# Patient Record
Sex: Female | Born: 2000 | Race: Black or African American | Hispanic: No | Marital: Single | Smoking: Never smoker
Health system: Southern US, Community
[De-identification: ages and names within clinical notes are randomized; demographics above are authoritative.]

## PROBLEM LIST (undated history)

## (undated) DIAGNOSIS — I82409 Acute embolism and thrombosis of unspecified deep veins of unspecified lower extremity: Secondary | ICD-10-CM

---
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
## 2019-01-09 IMAGING — DX DG CHEST 2V
2 series · 2 of 2 positions shown · non-contrast
Comparison: November 21, 2011

CLINICAL DATA: Recent deep venous thrombosis

EXAM:
CHEST - 2 VIEW

[chest pa]
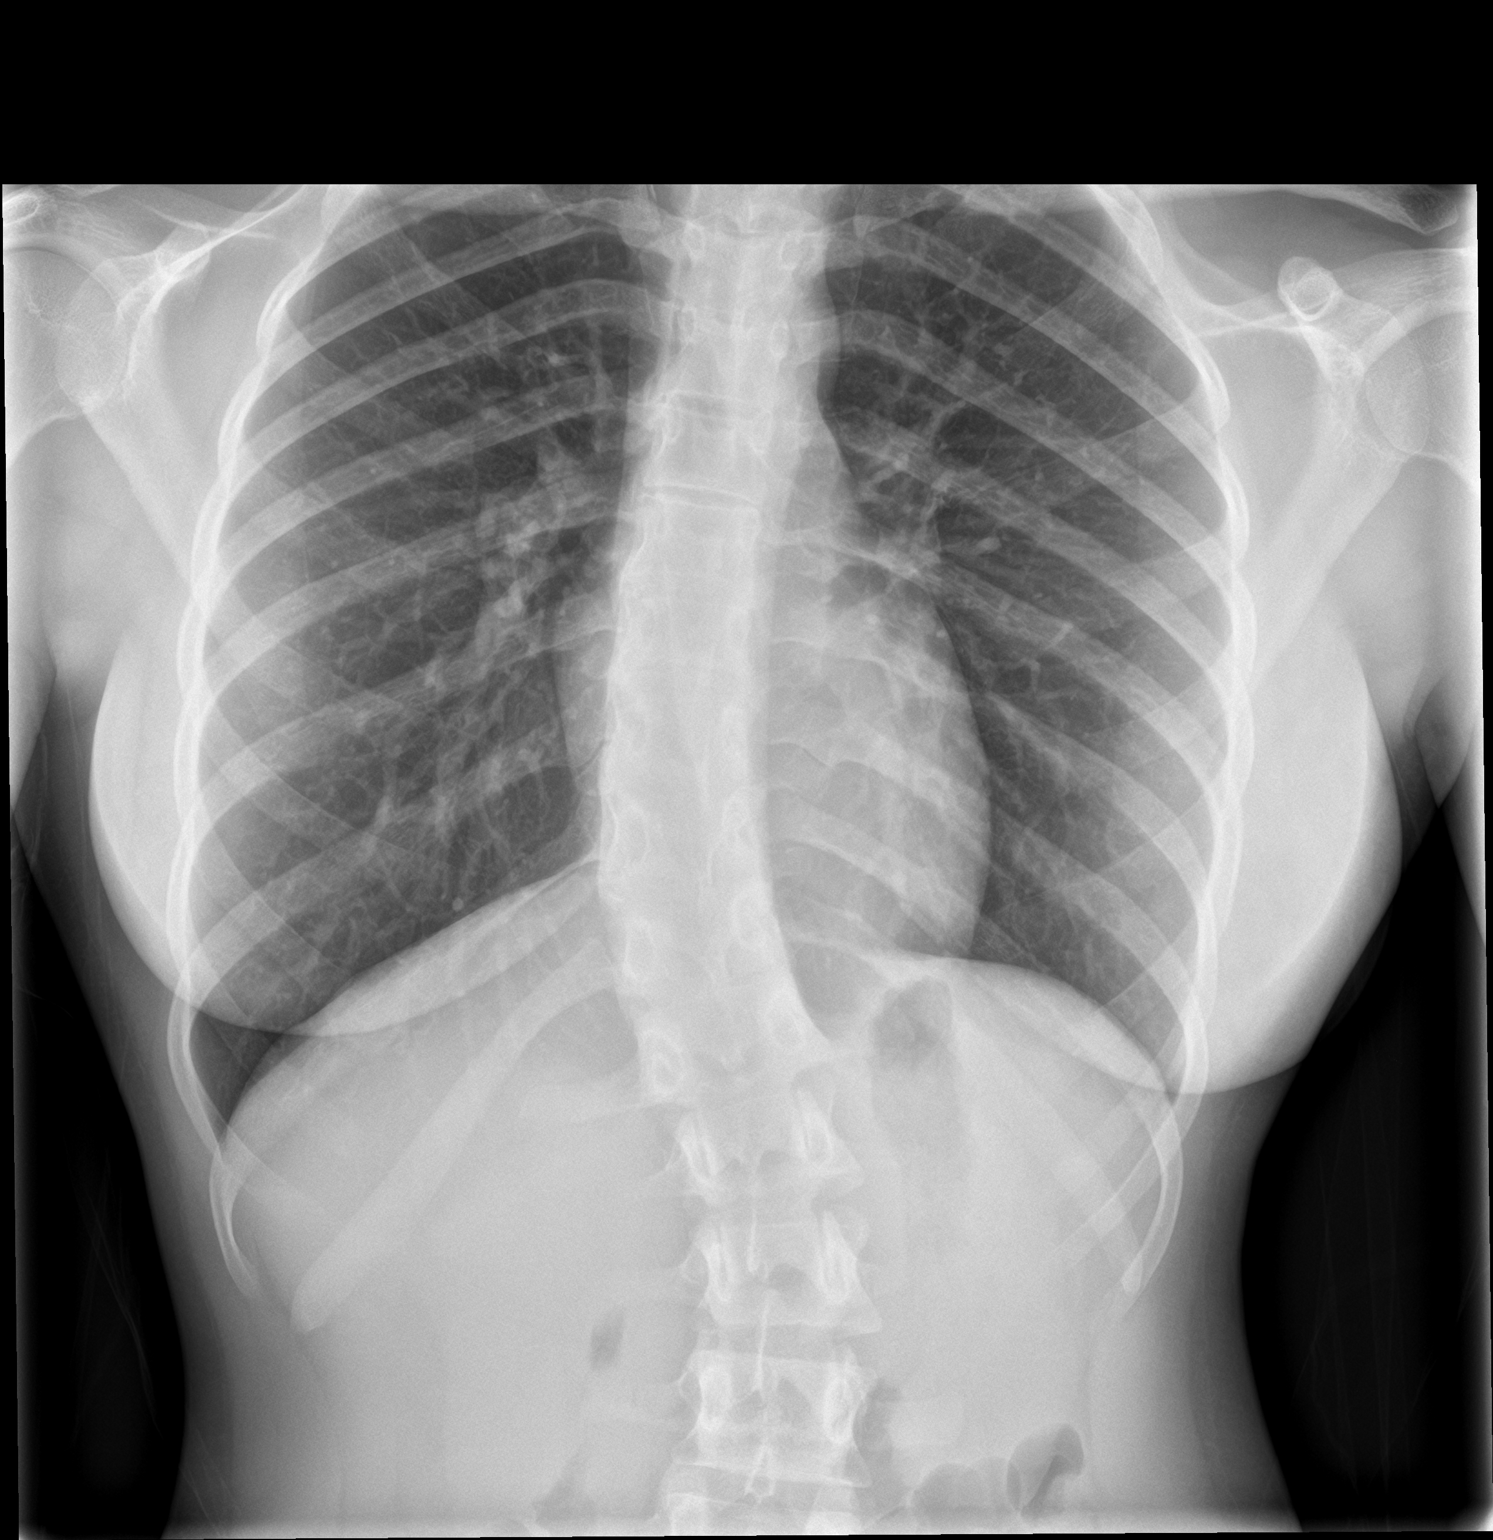

[chest lat]
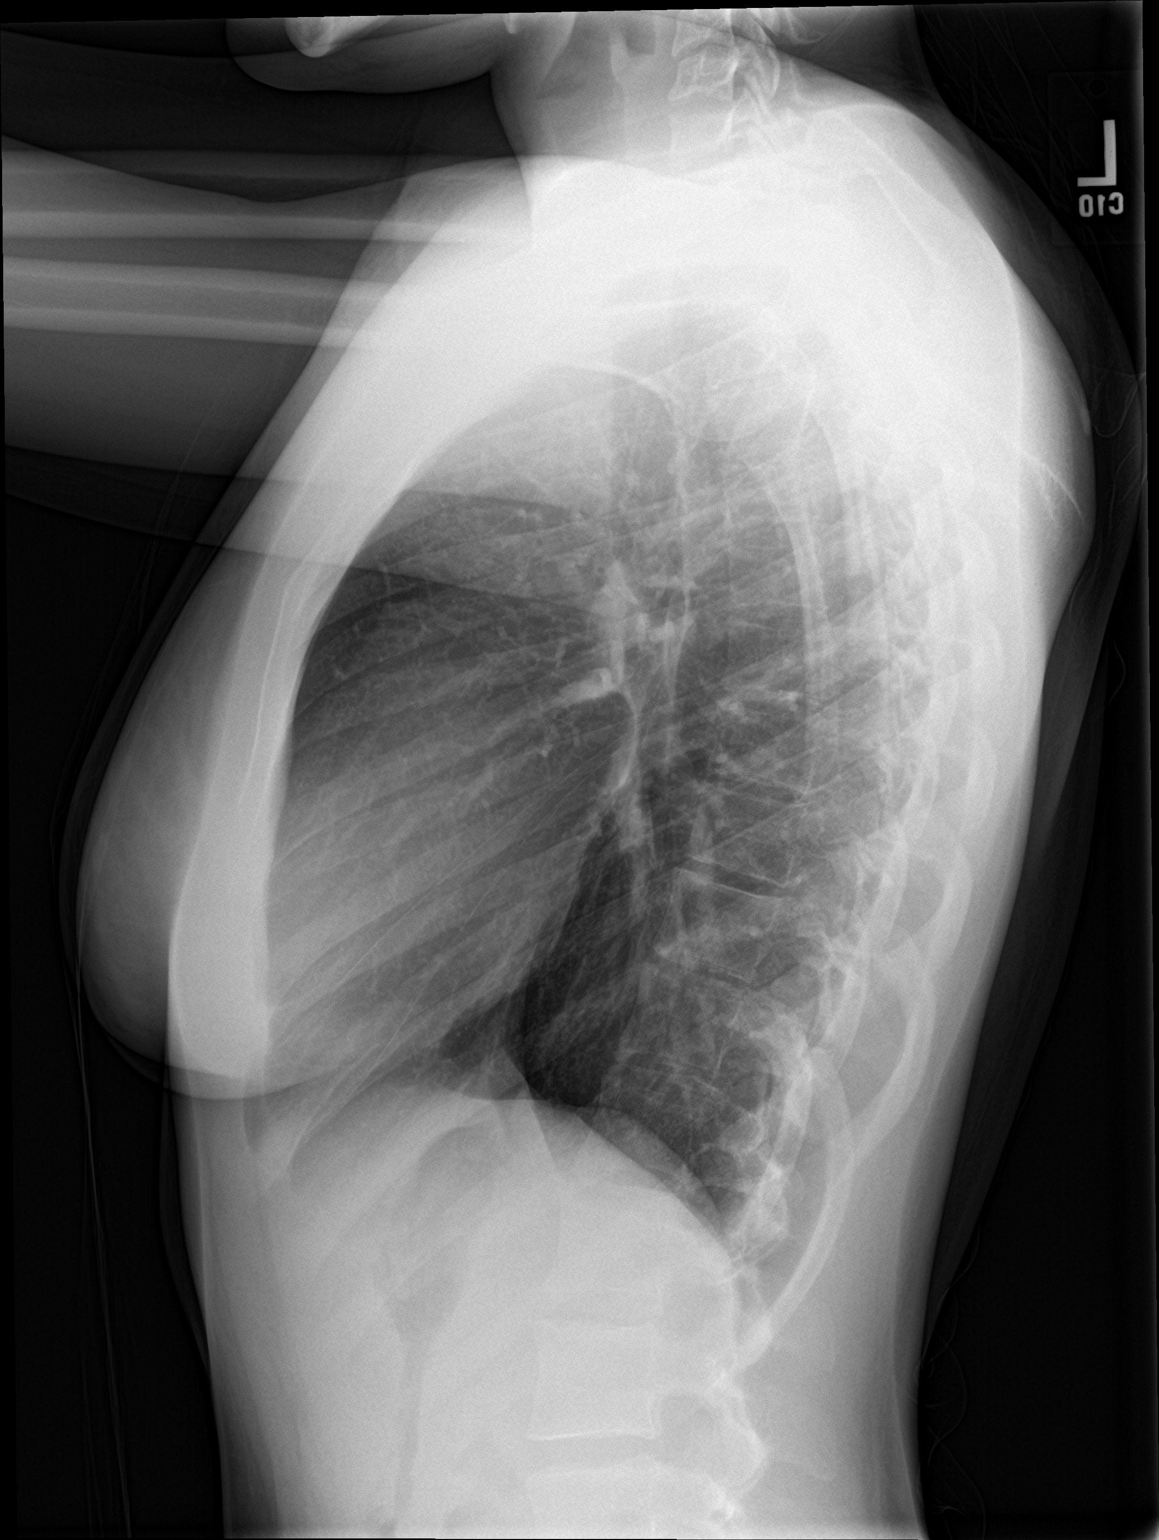

[2 of 2 positions shown; findings below may reference images not displayed]

FINDINGS: Lungs are clear. The heart size and pulmonary vascularity are
normal. No adenopathy. There is lower thoracic dextroscoliosis with
upper lumbar levoscoliosis.
IMPRESSION: Scoliosis.  Lungs clear.  No evident adenopathy.

## 2019-01-16 ENCOUNTER — Other Ambulatory Visit: Payer: Self-pay

## 2019-01-17 ENCOUNTER — Encounter: Payer: Self-pay | Admitting: Family

## 2019-01-17 ENCOUNTER — Ambulatory Visit (INDEPENDENT_AMBULATORY_CARE_PROVIDER_SITE_OTHER): Payer: Medicaid Other | Admitting: Family

## 2019-01-17 ENCOUNTER — Ambulatory Visit (INDEPENDENT_AMBULATORY_CARE_PROVIDER_SITE_OTHER): Payer: Medicaid Other

## 2019-01-17 VITALS — BP 117/66 | HR 73 | Temp 98.0°F | Ht 61.0 in | Wt 95.2 lb

## 2019-01-17 DIAGNOSIS — R634 Abnormal weight loss: Secondary | ICD-10-CM

## 2019-01-17 NOTE — Patient Instructions (Signed)

## 2019-01-17 NOTE — Progress Notes (Signed)
Subjective:    Patient ID: Nicole Hodge, female    DOB: 2000/03/24, 18 y.o.   MRN: [id number]  Chief Complaint  Patient presents with  . discuss weight    HPI  Pt presents to the office today to discuss weight loss. She weighed 119 lb in 11/16/17 and now weighs 95 lbs.  She has not tried to lose weight and states she eats 2-3 times a day. She wants to gain weight and feels like she is "too skinny". She is not active and does not do any scheduled  Exercise.   She has had two DVT's and is followed by Hematologists. She is currently taking xarelto 20 mg. She states she talked to her Hematologists about her weight loss and was told to follow up with her PCP.   She denies any fevers, dysuria, fatigue, abdominal pain, SOB, or chest pain.   She does work third shift. She states yesterday she ate 1  soft taco, 2 sloppy joes, 1 ham and cheese hot pocket, 2 small bags of  chips, fruit roll ups, and 3 granola bars.   Review of Systems  All other systems reviewed and are negative.      Objective:   Physical Exam Vitals reviewed.  Constitutional:      General: She is not in acute distress.    Appearance: She is well-developed.     Comments: underweight  HENT:     Head: Normocephalic and atraumatic.     Right Ear: Tympanic membrane normal.     Left Ear: Tympanic membrane normal.  Eyes:     Pupils: Pupils are equal, round, and reactive to light.  Neck:     Thyroid: No thyromegaly.  Cardiovascular:     Rate and Rhythm: Normal rate and regular rhythm.     Heart sounds: Normal heart sounds. No murmur.  Pulmonary:     Effort: Pulmonary effort is normal. No respiratory distress.     Breath sounds: Normal breath sounds. No wheezing.  Abdominal:     General: Bowel sounds are normal. There is no distension.     Palpations: Abdomen is soft.     Tenderness: There is no abdominal tenderness.  Musculoskeletal:        General: No tenderness. Normal range of motion.     Cervical back:  Normal range of motion and neck supple.  Skin:    General: Skin is warm and dry.  Neurological:     Mental Status: She is alert and oriented to person, place, and time.     Cranial Nerves: No cranial nerve deficit.     Deep Tendon Reflexes: Reflexes are normal and symmetric.  Psychiatric:        Behavior: Behavior normal.        Thought Content: Thought content normal.        Judgment: Judgment normal.       BP 117/66   Pulse 73   Temp 98 F (36.7 C) (Temporal)   Ht '5\' 1"'$  (1.549 m)   Wt 95 lb 3.2 oz (43.2 kg)   SpO2 99%   BMI 17.99 kg/m      Assessment & Plan:  Nicole Hodge comes in today with chief complaint of discuss weight   Diagnosis and orders addressed:  1. Weight loss Labs pending  High calorie diet discussed If all labs and x-ray normal will send to Endo to review Could be hormone related as she has had multiple birth controls this year  or related to working third shift. She does state that she eats well when she works night shift. According to her food log, she eats enough not to be losing weight.   - CMP14+EGFR - TSH - DG Chest 2 View; Future     Evelina Dun, FNP

## 2019-01-18 LAB — TSH: TSH: 2.26 u[IU]/mL (ref 0.450–4.500)

## 2019-01-18 LAB — CMP14+EGFR
ALT: 20 IU/L (ref 0–32)
AST: 24 IU/L (ref 0–40)
Albumin/Globulin Ratio: 1.4 (ref 1.2–2.2)
Albumin: 4.5 g/dL (ref 3.9–5.0)
Alkaline Phosphatase: 79 IU/L (ref 39–117)
BUN/Creatinine Ratio: 15 (ref 9–23)
BUN: 14 mg/dL (ref 6–20)
Bilirubin Total: 0.5 mg/dL (ref 0.0–1.2)
CO2: 21 mmol/L (ref 20–29)
Calcium: 9.5 mg/dL (ref 8.7–10.2)
Chloride: 108 mmol/L — ABNORMAL HIGH (ref 96–106)
Creatinine, Ser: 0.96 mg/dL (ref 0.57–1.00)
GFR calc Af Amer: 99 mL/min/{1.73_m2} (ref >59)
GFR calc non Af Amer: 86 mL/min/{1.73_m2} (ref >59)
Globulin, Total: 3.2 g/dL (ref 1.5–4.5)
Glucose: 72 mg/dL (ref 65–99)
Potassium: 3.6 mmol/L (ref 3.5–5.2)
Sodium: 144 mmol/L (ref 134–144)
Total Protein: 7.7 g/dL (ref 6.0–8.5)

## 2019-01-22 ENCOUNTER — Other Ambulatory Visit (HOSPITAL_COMMUNITY): Payer: Self-pay | Admitting: Surgery

## 2019-01-22 ENCOUNTER — Telehealth (HOSPITAL_COMMUNITY): Payer: Self-pay | Admitting: *Deleted

## 2019-01-22 ENCOUNTER — Other Ambulatory Visit (HOSPITAL_COMMUNITY): Payer: Self-pay | Admitting: Hematology

## 2019-01-22 ENCOUNTER — Telehealth (HOSPITAL_COMMUNITY): Payer: Self-pay | Admitting: Surgery

## 2019-01-22 DIAGNOSIS — D509 Iron deficiency anemia, unspecified: Secondary | ICD-10-CM

## 2019-01-22 MED ORDER — FERROUS SULFATE 325 (65 FE) MG PO TBEC: 325.0000 mg | DELAYED_RELEASE_TABLET | Freq: Every day | ORAL | 3 refills | Status: DC

## 2019-01-22 MED ORDER — VITAMIN D (CHOLECALCIFEROL) 50 MCG (2000 UT) PO CAPS: 50.0000 ug | ORAL_CAPSULE | Freq: Every day | ORAL | 5 refills | Status: DC

## 2019-01-22 NOTE — Telephone Encounter (Signed)
Pt had left a message asking if her Vit D prescription could be changed to a different form covered by medicaid.  Joseph Art was made aware of the pt's situation and changed the medication to Vit D 3.    I called the pt back to let her know of the change and she verbalized understanding.

## 2019-01-29 ENCOUNTER — Telehealth: Payer: Self-pay | Admitting: *Deleted

## 2019-01-29 NOTE — Telephone Encounter (Signed)
Virtual Visit Pre-Appointment Phone Call  Today, I spoke with Nicole Hodge and performed the following actions:  1. I explained that we are currently trying to limit exposure to the COVID-19 virus by seeing patients at home rather than in the office.  I explained that the visits are best done by video, but can be done by telephone.  I asked the patient if a virtual visit that the patient would like to try instead of coming into the office. Nicole Hodge agreed to proceed with the virtual visit scheduled with PA on 02/04/2019.        2. I confirmed the BEST phone number to call the day of the visit and- I included this in appointment notes.  3. I asked if the patient had access to (through a family member/friend) a smartphone with video capability to be used for her visit?"  The patient said yes -          4. I confirmed consent by  a. sending through MyChart or by email the FULL LENGTH CONSENT FOR TELE-HEALTH VISIT as written at the end of this message or  b. verbally as listed below. i. This visit is being performed in the setting of COVID-19. ii. All virtual visits are billed to your insurance company just like a normal visit would be.   iii. We'd like you to understand that the technology does not allow for your provider to perform an examination, and thus may limit your provider's ability to fully assess your condition.  iv. If your provider identifies any concerns that need to be evaluated in person, we will make arrangements to do so.   v. Finally, though the technology is pretty good, we cannot assure that it will always work on either your or our end, and in the setting of a video visit, we may have to convert it to a phone-only visit.  In either situation, we cannot ensure that we have a secure connection.   vi. Are you willing to proceed?"  STAFF: Did the patient verbally acknowledge consent to telehealth visit? Document YES/NO here: YES  2. I advised the patient to be  prepared - I asked that the patient, on the day of her visit, record any information possible with the equipment at her home, such as blood pressure, pulse, oxygen saturation, and your weight and write them all down. I asked the patient to have a pen and paper handy nearby the day of the visit as well.  3. If the patient was scheduled for a video visit, I informed the patient that the visit with the doctor would start with a text to the smartphone # given to [place] by the patient.         If the patient was scheduled for a telephone call, I informed the patient that the visit with the doctor would start with a call to the telephone # given to [place] by the patient.  4. I Informed patient they will receive a phone call 15 minutes prior to their appointment time from a CMA or nurse to review medications, allergies, etc. to prepare for the visit.    TELEPHONE CALL NOTE  Nicole Hodge has been deemed a candidate for a follow-up tele-health visit to limit community exposure during the Covid-19 pandemic. I spoke with the patient via phone to ensure availability of phone/video source, confirm preferred email & phone number, and discuss instructions and expectations.  I reminded Nicole Hodge  to be prepared with any vital sign and/or heart rhythm information that could potentially be obtained via home monitoring, at the time of her visit. I reminded Nicole Hodge to expect a phone call prior to her visit.  [name], NT 01/29/2019 9:53 AM     FULL LENGTH CONSENT FOR TELE-HEALTH VISIT   I hereby voluntarily request, consent and authorize [facility] and its employed or contracted physicians, physician assistants, nurse practitioners or other licensed health care professionals (the Practitioner), to provide me with telemedicine health care services (the "Services") as deemed necessary by the treating Practitioner. I acknowledge and consent to receive the Services by the Practitioner via  telemedicine. I understand that the telemedicine visit will involve communicating with the Practitioner through live audiovisual communication technology and the disclosure of certain medical information by electronic transmission. I acknowledge that I have been given the opportunity to request an in-person assessment or other available alternative prior to the telemedicine visit and am voluntarily participating in the telemedicine visit.  I understand that I have the right to withhold or withdraw my consent to the use of telemedicine in the course of my care at any time, without affecting my right to future care or treatment, and that the Practitioner or I may terminate the telemedicine visit at any time. I understand that I have the right to inspect all information obtained and/or recorded in the course of the telemedicine visit and may receive copies of available information for a reasonable fee.  I understand that some of the potential risks of receiving the Services via telemedicine include:  Marland Kitchen Delay or interruption in medical evaluation due to technological equipment failure or disruption; . Information transmitted may not be sufficient (e.g. poor resolution of images) to allow for appropriate medical decision making by the Practitioner; and/or  . In rare instances, security protocols could fail, causing a breach of personal health information.  Furthermore, I acknowledge that it is my responsibility to provide information about my medical history, conditions and care that is complete and accurate to the best of my ability. I acknowledge that Practitioner's advice, recommendations, and/or decision may be based on factors not within their control, such as incomplete or inaccurate data provided by me or distortions of diagnostic images or specimens that may result from electronic transmissions. I understand that the practice of medicine is not an exact science and that Practitioner makes no warranties or  guarantees regarding treatment outcomes. I acknowledge that I will receive a copy of this consent concurrently upon execution via email to the email address I last provided but may also request a printed copy by calling the office of [place].    I understand that my insurance will be billed for this visit.   I have read or had this consent read to me. . I understand the contents of this consent, which adequately explains the benefits and risks of the Services being provided via telemedicine.  . I have been provided ample opportunity to ask questions regarding this consent and the Services and have had my questions answered to my satisfaction. . I give my informed consent for the services to be provided through the use of telemedicine in my medical care  By participating in this telemedicine visit I agree to the above.

## 2019-01-30 ENCOUNTER — Other Ambulatory Visit: Payer: Self-pay

## 2019-01-30 ENCOUNTER — Telehealth (HOSPITAL_COMMUNITY): Payer: Self-pay

## 2019-01-30 DIAGNOSIS — I82521 Chronic embolism and thrombosis of right iliac vein: Secondary | ICD-10-CM

## 2019-01-30 DIAGNOSIS — I82421 Acute embolism and thrombosis of right iliac vein: Secondary | ICD-10-CM

## 2019-01-30 NOTE — Telephone Encounter (Signed)

## 2019-02-03 ENCOUNTER — Ambulatory Visit (HOSPITAL_COMMUNITY)
Admission: RE | Admit: 2019-02-03 | Discharge: 2019-02-03 | Disposition: A | Discharge status: Home / Self Care | Payer: Medicaid Other | Source: Ambulatory Visit | Attending: Surgery | Admitting: Surgery

## 2019-02-03 ENCOUNTER — Other Ambulatory Visit: Payer: Self-pay

## 2019-02-03 ENCOUNTER — Ambulatory Visit (INDEPENDENT_AMBULATORY_CARE_PROVIDER_SITE_OTHER)
Admission: RE | Admit: 2019-02-03 | Discharge: 2019-02-03 | Disposition: A | Discharge status: Home / Self Care | Payer: Medicaid Other | Source: Ambulatory Visit | Attending: Surgery | Admitting: Surgery

## 2019-02-03 DIAGNOSIS — I82521 Chronic embolism and thrombosis of right iliac vein: Secondary | ICD-10-CM | POA: Insufficient documentation

## 2019-02-04 ENCOUNTER — Ambulatory Visit (INDEPENDENT_AMBULATORY_CARE_PROVIDER_SITE_OTHER): Payer: Medicaid Other | Admitting: Physician Assistant

## 2019-02-04 VITALS — Temp 97.0°F | Ht 61.0 in | Wt 95.0 lb

## 2019-02-04 DIAGNOSIS — I82421 Acute embolism and thrombosis of right iliac vein: Secondary | ICD-10-CM

## 2019-02-04 NOTE — Progress Notes (Signed)
    Virtual Visit via Telephone Note  I connected with Nicole Hodge on 02/04/2019 using the [email or web address] by telephone and verified that I was speaking with the correct person using two identifiers. Patient was located at home. I am located at [facility] office.   The limitations of evaluation and management by telemedicine and the availability of in person appointments have been previously discussed with the patient and are documented in the patients chart. The patient expressed understanding and consented to proceed.  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  She underwent mechanical thrombectomy of R common iliac, external iliac, and common femoral vein by Dr. Chestine Spore in May of 2020.  She has been wearing thigh high compression regularly since surgery.  She still has edema of RLE however this is manageable.  She denies venous ulcerations.  She is taking her Xarelto daily.  She does state that her compression is no longer tight and are easier to get on.  She had follow up venous duplex yesterday.  Past Medical History:  Diagnosis Date  . DVT (deep venous thrombosis) (HCC)     Past Surgical History:  Procedure Laterality Date  . IVC VENOGRAPHY N/A 06/06/2018   Procedure: IVC Venography;  Surgeon: Maeola Harman, MD;  [facility]: [facility] INVASIVE CV LAB;  Service: Cardiovascular;  Laterality: N/A;  . PERIPHERAL VASCULAR THROMBECTOMY N/A 06/06/2018   Procedure: PERIPHERAL VASCULAR THROMBECTOMY;  Surgeon: Maeola Harman, MD;  Location: [facility] INVASIVE CV LAB;  Service: Cardiovascular;  Laterality: N/A;    Current Meds  Medication Sig  . ergocalciferol (VITAMIN D2) 1.25 MG (50000 UT) capsule Take 1 capsule (50,000 Units total) by mouth once a week.  . Vitamin D, Cholecalciferol, 50 MCG (2000 UT) CAPS Take 50 mcg by  mouth daily.  Carlena Hurl 20 MG TABS tablet TAKE 1 TABLET BY MOUTH DAILY WITH SUPPER. (Patient taking differently: Take 20 mg by mouth every morning. 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 The patient was provided an opportunity to ask questions and all were answered. The patient agreed with the plan and demonstrated an understanding of the instructions.   The patient was advised to call back or seek an in-person evaluation if the symptoms worsen or if the condition fails to improve as anticipated.  I spent 15 minutes with the patient via telephone encounter.   Signed, Emilie Rutter [facility] Office: [phone]  02/04/2019, 2:43 PM

## 2019-02-11 ENCOUNTER — Other Ambulatory Visit: Payer: Self-pay | Admitting: *Deleted

## 2019-02-11 DIAGNOSIS — I82421 Acute embolism and thrombosis of right iliac vein: Secondary | ICD-10-CM

## 2019-02-11 DIAGNOSIS — I82521 Chronic embolism and thrombosis of right iliac vein: Secondary | ICD-10-CM

## 2019-04-09 ENCOUNTER — Inpatient Hospital Stay (HOSPITAL_COMMUNITY): Payer: Medicaid Other | Attending: Hematology

## 2019-04-10 ENCOUNTER — Ambulatory Visit (HOSPITAL_COMMUNITY): Payer: Medicaid Other | Admitting: Nurse Practitioner

## 2019-04-17 ENCOUNTER — Inpatient Hospital Stay (HOSPITAL_COMMUNITY): Payer: Medicaid Other | Attending: Hematology

## 2019-04-23 ENCOUNTER — Ambulatory Visit (HOSPITAL_COMMUNITY): Payer: Medicaid Other | Admitting: Nurse Practitioner

## 2019-04-28 ENCOUNTER — Other Ambulatory Visit: Payer: Self-pay

## 2019-04-28 ENCOUNTER — Inpatient Hospital Stay (HOSPITAL_COMMUNITY): Payer: Medicaid Other | Attending: Hematology

## 2019-04-28 DIAGNOSIS — I82421 Acute embolism and thrombosis of right iliac vein: Secondary | ICD-10-CM | POA: Diagnosis not present

## 2019-04-28 LAB — CBC WITH DIFFERENTIAL/PLATELET
Abs Immature Granulocytes: 0.02 10*3/uL (ref 0.00–0.07)
Basophils Absolute: 0 10*3/uL (ref 0.0–0.1)
Basophils Relative: 0 %
Eosinophils Absolute: 0.1 10*3/uL (ref 0.0–0.5)
Eosinophils Relative: 1 %
HCT: 38.1 % (ref 36.0–46.0)
Hemoglobin: 11.9 g/dL — ABNORMAL LOW (ref 12.0–15.0)
Immature Granulocytes: 0 %
Lymphocytes Relative: 41 %
Lymphs Abs: 2.6 10*3/uL (ref 0.7–4.0)
MCH: 27 pg (ref 26.0–34.0)
MCHC: 31.2 g/dL (ref 30.0–36.0)
MCV: 86.4 fL (ref 80.0–100.0)
Monocytes Absolute: 0.3 10*3/uL (ref 0.1–1.0)
Monocytes Relative: 5 %
Neutro Abs: 3.3 10*3/uL (ref 1.7–7.7)
Neutrophils Relative %: 53 %
Platelets: 288 10*3/uL (ref 150–400)
RBC: 4.41 MIL/uL (ref 3.87–5.11)
RDW: 17.8 % — ABNORMAL HIGH (ref 11.5–15.5)
WBC: 6.4 10*3/uL (ref 4.0–10.5)
nRBC: 0 % (ref 0.0–0.2)

## 2019-04-28 LAB — FERRITIN: Ferritin: 6 ng/mL — ABNORMAL LOW (ref 11–307)

## 2019-04-28 LAB — COMPREHENSIVE METABOLIC PANEL
ALT: 19 U/L (ref 0–44)
AST: 21 U/L (ref 15–41)
Albumin: 4.2 g/dL (ref 3.5–5.0)
Alkaline Phosphatase: 66 U/L (ref 38–126)
Anion gap: 10 (ref 5–15)
BUN: 16 mg/dL (ref 6–20)
CO2: 26 mmol/L (ref 22–32)
Calcium: 9.4 mg/dL (ref 8.9–10.3)
Chloride: 101 mmol/L (ref 98–111)
Creatinine, Ser: 0.82 mg/dL (ref 0.44–1.00)
GFR calc Af Amer: >60 mL/min (ref >60)
GFR calc non Af Amer: >60 mL/min (ref >60)
Glucose, Bld: 91 mg/dL (ref 70–99)
Potassium: 4 mmol/L (ref 3.5–5.1)
Sodium: 137 mmol/L (ref 135–145)
Total Bilirubin: 0.4 mg/dL (ref 0.3–1.2)
Total Protein: 8 g/dL (ref 6.5–8.1)

## 2019-04-28 LAB — IRON AND TIBC
Iron: 25 ug/dL — ABNORMAL LOW (ref 28–170)
Saturation Ratios: 6 % — ABNORMAL LOW (ref 10.4–31.8)
TIBC: 397 ug/dL (ref 250–450)
UIBC: 372 ug/dL

## 2019-04-28 LAB — VITAMIN D 25 HYDROXY (VIT D DEFICIENCY, FRACTURES): Vit D, 25-Hydroxy: 15 ng/mL — ABNORMAL LOW (ref 30–100)

## 2019-04-28 LAB — FOLATE: Folate: 10.3 ng/mL (ref >5.9)

## 2019-04-28 LAB — LACTATE DEHYDROGENASE: LDH: 136 U/L (ref 98–192)

## 2019-04-28 LAB — VITAMIN B12: Vitamin B-12: 596 pg/mL (ref 180–914)

## 2019-05-07 ENCOUNTER — Other Ambulatory Visit: Payer: Self-pay

## 2019-05-07 ENCOUNTER — Inpatient Hospital Stay (HOSPITAL_COMMUNITY): Payer: Medicaid Other | Attending: Hematology | Admitting: Nurse Practitioner

## 2019-05-07 DIAGNOSIS — E559 Vitamin D deficiency, unspecified: Secondary | ICD-10-CM | POA: Insufficient documentation

## 2019-05-07 DIAGNOSIS — R110 Nausea: Secondary | ICD-10-CM | POA: Insufficient documentation

## 2019-05-07 DIAGNOSIS — Z7901 Long term (current) use of anticoagulants: Secondary | ICD-10-CM | POA: Insufficient documentation

## 2019-05-07 DIAGNOSIS — Z820 Family history of epilepsy and other diseases of the nervous system: Secondary | ICD-10-CM | POA: Diagnosis not present

## 2019-05-07 DIAGNOSIS — I82411 Acute embolism and thrombosis of right femoral vein: Secondary | ICD-10-CM | POA: Insufficient documentation

## 2019-05-07 DIAGNOSIS — R630 Anorexia: Secondary | ICD-10-CM | POA: Diagnosis not present

## 2019-05-07 DIAGNOSIS — I82421 Acute embolism and thrombosis of right iliac vein: Secondary | ICD-10-CM

## 2019-05-07 DIAGNOSIS — Z596 Low income: Secondary | ICD-10-CM | POA: Insufficient documentation

## 2019-05-07 DIAGNOSIS — D649 Anemia, unspecified: Secondary | ICD-10-CM | POA: Insufficient documentation

## 2019-05-07 DIAGNOSIS — R5383 Other fatigue: Secondary | ICD-10-CM | POA: Insufficient documentation

## 2019-05-07 DIAGNOSIS — Z86718 Personal history of other venous thrombosis and embolism: Secondary | ICD-10-CM | POA: Diagnosis not present

## 2019-05-07 MED ORDER — ERGOCALCIFEROL 1.25 MG (50000 UT) PO CAPS: 50000.0000 [IU] | ORAL_CAPSULE | ORAL | 3 refills | Status: DC

## 2019-05-07 NOTE — Patient Instructions (Signed)
[facility] at [facility] Discharge Instructions  Follow up in 6 months with labs    Thank you for choosing [facility] at [facility] to provide your oncology and hematology care.  To afford each patient quality time with our provider, please arrive at least 15 minutes before your scheduled appointment time.   If you have a lab appointment with the Cancer Center please come in thru the Main Entrance and check in at the main information desk.  You need to re-schedule your appointment should you arrive 10 or more minutes late.  We strive to give you quality time with our providers, and arriving late affects you and other patients whose appointments are after yours.  Also, if you no show three or more times for appointments you may be dismissed from the clinic at the providers discretion.     Again, thank you for choosing [facility].  Our hope is that these requests will decrease the amount of time that you wait before being seen by our physicians.       _____________________________________________________________  Should you have questions after your visit to [facility], please contact our office at [phone] between the hours of 8:00 a.m. and 4:30 p.m.  Voicemails left after 4:00 p.m. will not be returned until the following business day.  For prescription refill requests, have your pharmacy contact our office and allow 72 hours.    Due to Covid, you will need to wear a mask upon entering the hospital. If you do not have a mask, a mask will be given to you at the Main Entrance upon arrival. For doctor visits, patients may have 1 support person with them. For treatment visits, patients can not have anyone with them due to social distancing guidelines and our immunocompromised population.      

## 2019-05-07 NOTE — Progress Notes (Signed)
[facility] [address], [place] [address]   CLINIC:  Medical Oncology/Hematology  PCP:  Nicole Spencer, FNP [address] [place] [address] [phone]   REASON FOR VISIT: Follow-up for recurrent DVT  CURRENT THERAPY: Xarelto   INTERVAL HISTORY:  Nicole Hodge 19 y.o. female returns for routine follow-up for recurrent DVT.  Patient reports she has not been in the hospital since her last visit.  She has no leg swelling.  She is taking her medication as prescribed. Denies any nausea, vomiting, or diarrhea. Denies any new pains. Had not noticed any recent bleeding such as epistaxis, hematuria or hematochezia. Denies recent chest pain on exertion, shortness of breath on minimal exertion, pre-syncopal episodes, or palpitations. Denies any numbness or tingling in hands or feet. Denies any recent fevers, infections, or recent hospitalizations. Patient reports appetite at 50% and energy level at 50%.  She is eating well and gaining a few pounds.   REVIEW OF SYSTEMS:  Review of Systems  All other systems reviewed and are negative.    PAST MEDICAL/SURGICAL HISTORY:  Past Medical History:  Diagnosis Date  . DVT (deep venous thrombosis) (HCC)    Past Surgical History:  Procedure Laterality Date  . IVC VENOGRAPHY N/A 06/06/2018   Procedure: IVC Venography;  Surgeon: Maeola Harman, MD;  Location[facility] INVASIVE CV LAB;  Service: Cardiovascular;  Laterality: N/A;  . PERIPHERAL VASCULAR THROMBECTOMY N/A 06/06/2018   Procedure: PERIPHERAL VASCULAR THROMBECTOMY;  Surgeon: Maeola Harman, MD;  Location: [facility] INVASIVE CV LAB;  Service: Cardiovascular;  Laterality: N/A;     SOCIAL HISTORY:  Social History   Socioeconomic History  . Marital status: Single    Spouse name: Not on file  . Number of children: 0  . Years of education: Not on file  . Highest education level: Not on file  Occupational History  . Not on file  Tobacco Use  .  Smoking status: Never Smoker  . Smokeless tobacco: Never Used  Substance and Sexual Activity  . Alcohol use: No  . Drug use: No  . Sexual activity: Yes    Birth control/protection: None  Other Topics Concern  . Not on file  Social History Narrative  . Not on file   Social Determinants of Health   Financial Resource Strain:   . Difficulty of Paying Living Expenses:   Food Insecurity:   . Worried About Programme researcher, broadcasting/film/video in the Last Year:   . Barista in the Last Year:   Transportation Needs:   . Freight forwarder (Medical):   Marland Kitchen Lack of Transportation (Non-Medical):   Physical Activity:   . Days of Exercise per Week:   . Minutes of Exercise per Session:   Stress:   . Feeling of Stress :   Social Connections:   . Frequency of Communication with Friends and Family:   . Frequency of Social Gatherings with Friends and Family:   . Attends Religious Services:   . Active Member of Clubs or Organizations:   . Attends Banker Meetings:   Marland Kitchen Marital Status:   Intimate Partner Violence:   . Fear of Current or Ex-Partner:   . Emotionally Abused:   Marland Kitchen Physically Abused:   . Sexually Abused:     FAMILY HISTORY:  Family History  Problem Relation Age of Onset  . Healthy Mother   . Healthy Father   . Cerebral palsy Sister   . Healthy Brother  CURRENT MEDICATIONS:  Outpatient Encounter Medications as of 05/07/2019  Medication Sig  . XARELTO 20 MG TABS tablet TAKE 1 TABLET BY MOUTH DAILY WITH SUPPER. (Patient taking differently: Take 20 mg by mouth every morning. )  . ergocalciferol (VITAMIN D2) 1.25 MG (50000 UT) capsule Take 1 capsule (50,000 Units total) by mouth once a week. (Patient not taking: Reported on 05/07/2019)  . Vitamin D, Cholecalciferol, 50 MCG (2000 UT) CAPS Take 50 mcg by mouth daily. (Patient not taking: Reported on 05/07/2019)   No facility-administered encounter medications on file as of 05/07/2019.    ALLERGIES:  No Known  Allergies   PHYSICAL EXAM:  ECOG Performance status: 1  Vitals:   05/07/19 0900  BP: 108/67  Pulse: (!) 59  Resp: 18  Temp: (!) 96.9 F (36.1 C)  SpO2: 95%   Filed Weights   05/07/19 0900  Weight: 101 lb 9.6 oz (46.1 kg)    Physical Exam Constitutional:      Appearance: Normal appearance. She is normal weight.  Cardiovascular:     Rate and Rhythm: Normal rate and regular rhythm.     Heart sounds: Normal heart sounds.  Pulmonary:     Effort: Pulmonary effort is normal.     Breath sounds: Normal breath sounds.  Abdominal:     General: Bowel sounds are normal.     Palpations: Abdomen is soft.  Musculoskeletal:        General: Normal range of motion.  Skin:    General: Skin is warm.  Neurological:     Mental Status: She is alert and oriented to person, place, and time. Mental status is at baseline.  Psychiatric:        Mood and Affect: Mood normal.        Behavior: Behavior normal.        Thought Content: Thought content normal.        Judgment: Judgment normal.      LABORATORY DATA:  I have reviewed the labs as listed.  CBC    Component Value Date/Time   WBC 6.4 04/28/2019 0923   RBC 4.41 04/28/2019 0923   HGB 11.9 (L) 04/28/2019 0923   HGB 10.9 (L) 01/19/2017 1122   HCT 38.1 04/28/2019 0923   HCT 33.1 (L) 01/19/2017 1122   PLT 288 04/28/2019 0923   PLT 369 01/19/2017 1122   MCV 86.4 04/28/2019 0923   MCV 77 (L) 01/19/2017 1122   MCH 27.0 04/28/2019 0923   MCHC 31.2 04/28/2019 0923   RDW 17.8 (H) 04/28/2019 0923   RDW 15.3 01/19/2017 1122   LYMPHSABS 2.6 04/28/2019 0923   LYMPHSABS 1.7 01/19/2017 1122   MONOABS 0.3 04/28/2019 0923   EOSABS 0.1 04/28/2019 0923   EOSABS 0.1 01/19/2017 1122   BASOSABS 0.0 04/28/2019 0923   BASOSABS 0.0 01/19/2017 1122   CMP Latest Ref Rng & Units 04/28/2019 01/17/2019 11/28/2018  Glucose 70 - 99 mg/dL 91 72 90  BUN 6 - 20 mg/dL 16 14 24(H)  Creatinine 0.44 - 1.00 mg/dL 0.82 0.96 1.03(H)  Sodium 135 - 145 mmol/L  137 144 138  Potassium 3.5 - 5.1 mmol/L 4.0 3.6 4.0  Chloride 98 - 111 mmol/L 101 108(H) 105  CO2 22 - 32 mmol/L 26 21 22   Calcium 8.9 - 10.3 mg/dL 9.4 9.5 8.8(L)  Total Protein 6.5 - 8.1 g/dL 8.0 7.7 7.7  Total Bilirubin 0.3 - 1.2 mg/dL 0.4 0.5 0.5  Alkaline Phos 38 - 126 U/L 66 79 65  AST  15 - 41 U/L 21 24 19   ALT 0 - 44 U/L 19 20 16    I personally performed a face-to-face visit,   All questions were answered to patient's stated satisfaction. Encouraged patient to call with any new concerns or questions before his next visit to the cancer center and we can certain see him sooner, if needed.     ASSESSMENT & PLAN:   Right leg DVT (HCC) 1.  Right leg DVT: - She had a Doppler on 11/13/2017 which showed extensive occlusive DVT extending from the right common femoral vein through the proximal aspect of the right popliteal vein. -Patient was on nexplanon (progestin contraceptive) at that time, which was implanted in June or July 2019.  This was later taken out. - Patient was placed on Xarelto 20 mg and tolerated it well.  She completed 6 months of anticoagulation and discontinued Xarelto around 05/15/2018. - It was thought her DVT was provoked from contraceptive.  So only 6 months was recommended at that time. - Initial testing for lupus anticoagulant was consistent with beta-2 IgG of 39 and IgA of 145.  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  Prothrombin gene mutation was negative.  Factor V Leiden mutation was negative. -Repeat testing on 03/27/2018 showed negative lupus anticoagulant.  Beta-2 glycoprotein IgG and IgM were normal.  IgA levels decreased to 58. -On 05/30/2018 she ended up in the ER with right leg swelling.  She has a DVT involving the right common iliac, right internal and external iliac, and right common femoral veins. -She was placed back on Xarelto and will continue this indefinitely. - She reports she has no pain in her leg now.  She still has minimal swelling. -She reports no  further problems with DVTs or leg pain.  She denies any swelling in her leg now.  She is taking her Xarelto as prescribed. -We will see her back in 6 month with repeat labs.  2.  Normocytic anemia: -This is likely due from menstrual blood loss. -She was placed on oral iron tablets with a stool softener today on 05/31/2018.  She reports she stopped taking her iron tablets prior to August.  She reports they made her feel nauseous. -Labs drawn on 04/28/2019 showed her hemoglobin 11.9, ferritin 6, percent saturation 6. -We will set her up for 2 infusions of IV iron. -She does report fatigue.  She denies any bright red bleeding per rectum or melena. -We will repeat an iron panel on her in 6 months with labs.  3.  Vitamin D deficiency: - Labs on 07/02/2018 showed her vitamin D level at 11.8.  She was prescribed vitamin D daily however she did not take it. -Labs on 11/28/2018 showed her vitamin D level is 13.23. -I have prescribed weekly vitamin D 50,000 units.  She will pick this up at her pharmacy -Labs done on 04/28/2019 showed her vitamin D level 15.00 -She reports she did not take her vitamin D.  We will call in a new prescription for her. -We will follow-up in 6 months with repeat labs.  4.  Decreased appetite: -Patient reports she only eats when she is hungry which is usually only twice a day. -She has lost 11 pounds since June 2020. -We talked about replacing missed meals with high-calorie shakes. -She has gained 6-1/2 pounds since her last visit.      Orders placed this encounter:  Orders Placed This Encounter  Procedures  . Lactate dehydrogenase  . CBC with Differential/Platelet  . Comprehensive metabolic  panel  . Ferritin  . Iron and TIBC  . Vitamin B12  . VITAMIN D 25 Hydroxy (Vit-D Deficiency, Fractures)  . Folate      Francene Finders, FNP-C [place] [phone]

## 2019-05-07 NOTE — Assessment & Plan Note (Addendum)
1.  Right leg DVT: - She had a Doppler on 11/13/2017 which showed extensive occlusive DVT extending from the right common femoral vein through the proximal aspect of the right popliteal vein. -Patient was on nexplanon (progestin contraceptive) at that time, which was implanted in June or July 2019.  This was later taken out. - Patient was placed on Xarelto 20 mg and tolerated it well.  She completed 6 months of anticoagulation and discontinued Xarelto around 05/15/2018. - It was thought her DVT was provoked from contraceptive.  So only 6 months was recommended at that time. - Initial testing for lupus anticoagulant was consistent with beta-2 IgG of 39 and IgA of 145.  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  Prothrombin gene mutation was negative.  Factor V Leiden mutation was negative. -Repeat testing on 03/27/2018 showed negative lupus anticoagulant.  Beta-2 glycoprotein IgG and IgM were normal.  IgA levels decreased to 58. -On 05/30/2018 she ended up in the ER with right leg swelling.  She has a DVT involving the right common iliac, right internal and external iliac, and right common femoral veins. -She was placed back on Xarelto and will continue this indefinitely. - She reports she has no pain in her leg now.  She still has minimal swelling. -She reports no further problems with DVTs or leg pain.  She denies any swelling in her leg now.  She is taking her Xarelto as prescribed. -We will see her back in 6 month with repeat labs.  2.  Normocytic anemia: -This is likely due from menstrual blood loss. -She was placed on oral iron tablets with a stool softener today on 05/31/2018.  She reports she stopped taking her iron tablets prior to August.  She reports they made her feel nauseous. -Labs drawn on 04/28/2019 showed her hemoglobin 11.9, ferritin 6, percent saturation 6. -We will set her up for 2 infusions of IV iron. -She does report fatigue.  She denies any bright red bleeding per rectum or melena. -We  will repeat an iron panel on her in 6 months with labs.  3.  Vitamin D deficiency: - Labs on 07/02/2018 showed her vitamin D level at 11.8.  She was prescribed vitamin D daily however she did not take it. -Labs on 11/28/2018 showed her vitamin D level is 13.23. -I have prescribed weekly vitamin D 50,000 units.  She will pick this up at her pharmacy -Labs done on 04/28/2019 showed her vitamin D level 15.00 -She reports she did not take her vitamin D.  We will call in a new prescription for her. -We will follow-up in 6 months with repeat labs.  4.  Decreased appetite: -Patient reports she only eats when she is hungry which is usually only twice a day. -She has lost 11 pounds since June 2020. -We talked about replacing missed meals with high-calorie shakes. -She has gained 6-1/2 pounds since her last visit.

## 2019-05-08 DIAGNOSIS — I82493 Acute embolism and thrombosis of other specified deep vein of lower extremity, bilateral: Secondary | ICD-10-CM | POA: Diagnosis not present

## 2019-05-08 DIAGNOSIS — D500 Iron deficiency anemia secondary to blood loss (chronic): Secondary | ICD-10-CM | POA: Insufficient documentation

## 2019-05-12 ENCOUNTER — Ambulatory Visit (HOSPITAL_COMMUNITY): Payer: Medicaid Other

## 2019-05-15 ENCOUNTER — Other Ambulatory Visit: Payer: Self-pay

## 2019-05-15 ENCOUNTER — Ambulatory Visit (HOSPITAL_COMMUNITY): Payer: Medicaid Other

## 2019-05-15 ENCOUNTER — Inpatient Hospital Stay (HOSPITAL_COMMUNITY): Payer: Medicaid Other

## 2019-05-15 VITALS — BP 120/60 | HR 82 | Temp 98.1°F | Resp 18

## 2019-05-15 DIAGNOSIS — Z820 Family history of epilepsy and other diseases of the nervous system: Secondary | ICD-10-CM | POA: Diagnosis not present

## 2019-05-15 DIAGNOSIS — Z7901 Long term (current) use of anticoagulants: Secondary | ICD-10-CM | POA: Diagnosis not present

## 2019-05-15 DIAGNOSIS — Z86718 Personal history of other venous thrombosis and embolism: Secondary | ICD-10-CM | POA: Diagnosis not present

## 2019-05-15 DIAGNOSIS — R110 Nausea: Secondary | ICD-10-CM | POA: Diagnosis not present

## 2019-05-15 DIAGNOSIS — E559 Vitamin D deficiency, unspecified: Secondary | ICD-10-CM | POA: Diagnosis not present

## 2019-05-15 DIAGNOSIS — R630 Anorexia: Secondary | ICD-10-CM | POA: Diagnosis not present

## 2019-05-15 DIAGNOSIS — D500 Iron deficiency anemia secondary to blood loss (chronic): Secondary | ICD-10-CM

## 2019-05-15 DIAGNOSIS — I82411 Acute embolism and thrombosis of right femoral vein: Secondary | ICD-10-CM | POA: Diagnosis not present

## 2019-05-15 DIAGNOSIS — R5383 Other fatigue: Secondary | ICD-10-CM | POA: Diagnosis not present

## 2019-05-15 DIAGNOSIS — Z596 Low income: Secondary | ICD-10-CM | POA: Diagnosis not present

## 2019-05-15 DIAGNOSIS — D649 Anemia, unspecified: Secondary | ICD-10-CM | POA: Diagnosis not present

## 2019-05-15 MED ORDER — SODIUM CHLORIDE 0.9 % IV SOLN
510.0000 mg | Freq: Once | INTRAVENOUS | Status: AC
  Administered 2019-05-15: 510 mg via INTRAVENOUS
  Filled 2019-05-15: qty 510

## 2019-05-15 MED ORDER — SODIUM CHLORIDE 0.9 % IV SOLN: Freq: Once | INTRAVENOUS | Status: AC

## 2019-05-19 ENCOUNTER — Ambulatory Visit (HOSPITAL_COMMUNITY): Payer: Medicaid Other

## 2019-05-22 ENCOUNTER — Ambulatory Visit (HOSPITAL_COMMUNITY): Payer: Medicaid Other

## 2019-05-23 ENCOUNTER — Inpatient Hospital Stay (HOSPITAL_COMMUNITY): Payer: Medicaid Other

## 2019-05-23 ENCOUNTER — Encounter (HOSPITAL_COMMUNITY): Payer: Self-pay

## 2019-05-23 ENCOUNTER — Other Ambulatory Visit: Payer: Self-pay

## 2019-05-23 VITALS — BP 100/52 | HR 70 | Temp 98.0°F | Resp 18

## 2019-05-23 DIAGNOSIS — Z820 Family history of epilepsy and other diseases of the nervous system: Secondary | ICD-10-CM | POA: Diagnosis not present

## 2019-05-23 DIAGNOSIS — Z7901 Long term (current) use of anticoagulants: Secondary | ICD-10-CM | POA: Diagnosis not present

## 2019-05-23 DIAGNOSIS — D500 Iron deficiency anemia secondary to blood loss (chronic): Secondary | ICD-10-CM

## 2019-05-23 DIAGNOSIS — R110 Nausea: Secondary | ICD-10-CM | POA: Diagnosis not present

## 2019-05-23 DIAGNOSIS — D649 Anemia, unspecified: Secondary | ICD-10-CM | POA: Diagnosis not present

## 2019-05-23 DIAGNOSIS — R630 Anorexia: Secondary | ICD-10-CM | POA: Diagnosis not present

## 2019-05-23 DIAGNOSIS — R5383 Other fatigue: Secondary | ICD-10-CM | POA: Diagnosis not present

## 2019-05-23 DIAGNOSIS — Z596 Low income: Secondary | ICD-10-CM | POA: Diagnosis not present

## 2019-05-23 DIAGNOSIS — E559 Vitamin D deficiency, unspecified: Secondary | ICD-10-CM | POA: Diagnosis not present

## 2019-05-23 DIAGNOSIS — Z86718 Personal history of other venous thrombosis and embolism: Secondary | ICD-10-CM | POA: Diagnosis not present

## 2019-05-23 DIAGNOSIS — I82411 Acute embolism and thrombosis of right femoral vein: Secondary | ICD-10-CM | POA: Diagnosis not present

## 2019-05-23 MED ORDER — SODIUM CHLORIDE 0.9 % IV SOLN
510.0000 mg | Freq: Once | INTRAVENOUS | Status: AC
  Administered 2019-05-23: 510 mg via INTRAVENOUS
  Filled 2019-05-23: qty 510

## 2019-05-23 MED ORDER — SODIUM CHLORIDE 0.9 % IV SOLN: Freq: Once | INTRAVENOUS | Status: AC

## 2019-05-23 NOTE — Progress Notes (Signed)
Patient presents today for Feraheme infusion. MAR reviewed and updated. Patient has no complaints of any changes since her last visit.   Feraheme  given today per MD orders. Tolerated infusion without adverse affects. Vital signs stable. No complaints at this time. Discharged from clinic ambulatory. F/U with [facility] as scheduled.  

## 2019-05-23 NOTE — Patient Instructions (Signed)
[facility] at [facility]  Discharge Instructions:   _______________________________________________________________  Thank you for choosing [facility] at [facility] to provide your oncology and hematology care.  To afford each patient quality time with our providers, please arrive at least 15 minutes before your scheduled appointment.  You need to re-schedule your appointment if you arrive 10 or more minutes late.  We strive to give you quality time with our providers, and arriving late affects you and other patients whose appointments are after yours.  Also, if you no show three or more times for appointments you may be dismissed from the clinic.  Again, thank you for choosing [facility] at [facility]. Our hope is that these requests will allow you access to exceptional care and in a timely manner. _______________________________________________________________  If you have questions after your visit, please contact our office at [phone] between the hours of 8:30 a.m. and 5:00 p.m. Voicemails left after 4:30 p.m. will not be returned until the following business day. _______________________________________________________________  For prescription refill requests, have your pharmacy contact our office. _______________________________________________________________  Recommendations made by the consultant and any test results will be sent to your referring physician. _______________________________________________________________ 

## 2019-07-25 ENCOUNTER — Other Ambulatory Visit: Payer: Self-pay

## 2019-07-25 MED ORDER — RIVAROXABAN 20 MG PO TABS: ORAL_TABLET | ORAL | 3 refills | Status: DC

## 2019-10-30 ENCOUNTER — Other Ambulatory Visit: Payer: Self-pay

## 2019-10-30 ENCOUNTER — Inpatient Hospital Stay (HOSPITAL_COMMUNITY): Payer: Medicaid Other | Attending: Hematology

## 2019-10-30 DIAGNOSIS — I82421 Acute embolism and thrombosis of right iliac vein: Secondary | ICD-10-CM | POA: Diagnosis present

## 2019-10-30 LAB — COMPREHENSIVE METABOLIC PANEL
ALT: 17 U/L (ref 0–44)
AST: 17 U/L (ref 15–41)
Albumin: 4.3 g/dL (ref 3.5–5.0)
Alkaline Phosphatase: 50 U/L (ref 38–126)
Anion gap: 7 (ref 5–15)
BUN: 16 mg/dL (ref 6–20)
CO2: 24 mmol/L (ref 22–32)
Calcium: 8.9 mg/dL (ref 8.9–10.3)
Chloride: 104 mmol/L (ref 98–111)
Creatinine, Ser: 0.77 mg/dL (ref 0.44–1.00)
GFR calc Af Amer: >60 mL/min (ref >60)
GFR calc non Af Amer: >60 mL/min (ref >60)
Glucose, Bld: 81 mg/dL (ref 70–99)
Potassium: 3.6 mmol/L (ref 3.5–5.1)
Sodium: 135 mmol/L (ref 135–145)
Total Bilirubin: 0.4 mg/dL (ref 0.3–1.2)
Total Protein: 7.3 g/dL (ref 6.5–8.1)

## 2019-10-30 LAB — LACTATE DEHYDROGENASE: LDH: 119 U/L (ref 98–192)

## 2019-10-30 LAB — VITAMIN D 25 HYDROXY (VIT D DEFICIENCY, FRACTURES): Vit D, 25-Hydroxy: 18.25 ng/mL — ABNORMAL LOW (ref 30–100)

## 2019-10-30 LAB — CBC WITH DIFFERENTIAL/PLATELET
Abs Immature Granulocytes: 0.01 10*3/uL (ref 0.00–0.07)
Basophils Absolute: 0 10*3/uL (ref 0.0–0.1)
Basophils Relative: 0 %
Eosinophils Absolute: 0.1 10*3/uL (ref 0.0–0.5)
Eosinophils Relative: 1 %
HCT: 39 % (ref 36.0–46.0)
Hemoglobin: 12.5 g/dL (ref 12.0–15.0)
Immature Granulocytes: 0 %
Lymphocytes Relative: 36 %
Lymphs Abs: 2.5 10*3/uL (ref 0.7–4.0)
MCH: 28.5 pg (ref 26.0–34.0)
MCHC: 32.1 g/dL (ref 30.0–36.0)
MCV: 88.8 fL (ref 80.0–100.0)
Monocytes Absolute: 0.5 10*3/uL (ref 0.1–1.0)
Monocytes Relative: 7 %
Neutro Abs: 3.8 10*3/uL (ref 1.7–7.7)
Neutrophils Relative %: 56 %
Platelets: 210 10*3/uL (ref 150–400)
RBC: 4.39 MIL/uL (ref 3.87–5.11)
RDW: 15.5 % (ref 11.5–15.5)
WBC: 6.8 10*3/uL (ref 4.0–10.5)
nRBC: 0 % (ref 0.0–0.2)

## 2019-10-30 LAB — VITAMIN B12: Vitamin B-12: 510 pg/mL (ref 180–914)

## 2019-10-30 LAB — IRON AND TIBC
Iron: 57 ug/dL (ref 28–170)
Saturation Ratios: 19 % (ref 10.4–31.8)
TIBC: 296 ug/dL (ref 250–450)
UIBC: 239 ug/dL

## 2019-10-30 LAB — FERRITIN: Ferritin: 84 ng/mL (ref 11–307)

## 2019-10-30 LAB — FOLATE: Folate: 13.2 ng/mL (ref >5.9)

## 2019-11-06 ENCOUNTER — Inpatient Hospital Stay (HOSPITAL_COMMUNITY): Payer: Medicaid Other | Attending: Hematology | Admitting: Hematology

## 2019-11-06 ENCOUNTER — Other Ambulatory Visit: Payer: Self-pay

## 2019-11-06 VITALS — BP 122/60 | HR 68 | Temp 96.9°F | Resp 18 | Wt 103.8 lb

## 2019-11-06 DIAGNOSIS — D509 Iron deficiency anemia, unspecified: Secondary | ICD-10-CM | POA: Diagnosis not present

## 2019-11-06 DIAGNOSIS — G479 Sleep disorder, unspecified: Secondary | ICD-10-CM | POA: Insufficient documentation

## 2019-11-06 DIAGNOSIS — Z79899 Other long term (current) drug therapy: Secondary | ICD-10-CM | POA: Insufficient documentation

## 2019-11-06 DIAGNOSIS — R5383 Other fatigue: Secondary | ICD-10-CM | POA: Diagnosis not present

## 2019-11-06 DIAGNOSIS — N920 Excessive and frequent menstruation with regular cycle: Secondary | ICD-10-CM | POA: Insufficient documentation

## 2019-11-06 DIAGNOSIS — Z7901 Long term (current) use of anticoagulants: Secondary | ICD-10-CM | POA: Diagnosis not present

## 2019-11-06 DIAGNOSIS — M7989 Other specified soft tissue disorders: Secondary | ICD-10-CM | POA: Insufficient documentation

## 2019-11-06 DIAGNOSIS — Z86718 Personal history of other venous thrombosis and embolism: Secondary | ICD-10-CM | POA: Diagnosis not present

## 2019-11-06 DIAGNOSIS — I82411 Acute embolism and thrombosis of right femoral vein: Secondary | ICD-10-CM | POA: Diagnosis not present

## 2019-11-06 DIAGNOSIS — E559 Vitamin D deficiency, unspecified: Secondary | ICD-10-CM | POA: Diagnosis not present

## 2019-11-06 DIAGNOSIS — D6862 Lupus anticoagulant syndrome: Secondary | ICD-10-CM | POA: Insufficient documentation

## 2019-11-06 MED ORDER — ERGOCALCIFEROL 1.25 MG (50000 UT) PO CAPS: 50000.0000 [IU] | ORAL_CAPSULE | ORAL | 6 refills | Status: DC

## 2019-11-06 NOTE — Patient Instructions (Signed)
[facility] at [facility] Discharge Instructions  You were seen today by Dr. Ellin Saba. He went over your recent results. You will be prescribed vitamin D to take take weekly. Dr. Ellin Saba will see you back in 4 months for labs and follow up.   Thank you for choosing [facility] at [facility] to provide your oncology and hematology care.  To afford each patient quality time with our provider, please arrive at least 15 minutes before your scheduled appointment time.   If you have a lab appointment with the Cancer Center please come in thru the Main Entrance and check in at the main information desk  You need to re-schedule your appointment should you arrive 10 or more minutes late.  We strive to give you quality time with our providers, and arriving late affects you and other patients whose appointments are after yours.  Also, if you no show three or more times for appointments you may be dismissed from the clinic at the providers discretion.     Again, thank you for choosing [facility].  Our hope is that these requests will decrease the amount of time that you wait before being seen by our physicians.       _____________________________________________________________  Should you have questions after your visit to [facility], please contact our office at [phone] between the hours of 8:00 a.m. and 4:30 p.m.  Voicemails left after 4:00 p.m. will not be returned until the following business day.  For prescription refill requests, have your pharmacy contact our office and allow 72 hours.    Cancer Center Support Programs:   > Cancer Support Group  2nd Tuesday of the month 1pm-2pm, Journey Room

## 2019-11-06 NOTE — Progress Notes (Signed)
[facility] [address], [place] [address]   CLINIC:  Medical Oncology/Hematology  PCP:  [name], FNP [address] / [place] [address]  [phone]  REASON FOR VISIT:  Follow-up for right leg DVT and IDA  PRIOR THERAPY: Iron tablets daily  CURRENT THERAPY: Xarelto daily; intermittent Feraheme  INTERVAL HISTORY:  Nicole Hodge, a 19 y.o. female, returns for routine follow-up for her right leg DVT and IDA. Nicole Hodge was last seen on 04/02/2018. She received 2 iron infusions in mid-April 2021.  Today she reports that she developed another blood clot 2 weeks after stopping Xarelto the first time. She is tolerating the Xarelto well without any issues. She stopped taking the iron tablet and is getting iron infusions intermittently. She reports that her right knee is still mildly swollen, but denies knee pain, dyspnea or SOB. She continues menstruating and sometimes gets heavy bleeding; her periods last 7 days with 3-4 days of heavy bleeding.   REVIEW OF SYSTEMS:  Review of Systems  Constitutional: Positive for appetite change (75%) and fatigue (75%).  Respiratory: Negative for shortness of breath.   Cardiovascular: Positive for leg swelling (R knee swelling).  Musculoskeletal: Negative for arthralgias.  Psychiatric/Behavioral: Positive for sleep disturbance.  All other systems reviewed and are negative.   PAST MEDICAL/SURGICAL HISTORY:  Past Medical History:  Diagnosis Date  . DVT (deep venous thrombosis) (HCC)    Past Surgical History:  Procedure Laterality Date  . IVC VENOGRAPHY N/A 06/06/2018   Procedure: IVC Venography;  Surgeon: Maeola Harman, MD;  Location: [facility] INVASIVE CV LAB;  Service: Cardiovascular;  Laterality: N/A;  . PERIPHERAL VASCULAR THROMBECTOMY N/A 06/06/2018   Procedure: PERIPHERAL VASCULAR THROMBECTOMY;  Surgeon: Maeola Harman, MD;  Location: [facility] INVASIVE CV LAB;  Service: Cardiovascular;   Laterality: N/A;    SOCIAL HISTORY:  Social History   Socioeconomic History  . Marital status: Single    Spouse name: Not on file  . Number of children: 0  . Years of education: Not on file  . Highest education level: Not on file  Occupational History  . Not on file  Tobacco Use  . Smoking status: Never Smoker  . Smokeless tobacco: Never Used  Vaping Use  . Vaping Use: Never used  Substance and Sexual Activity  . Alcohol use: No  . Drug use: No  . Sexual activity: Yes    Birth control/protection: None  Other Topics Concern  . Not on file  Social History Narrative  . Not on file   Social Determinants of Health   Financial Resource Strain:   . Difficulty of Paying Living Expenses: Not on file  Food Insecurity:   . Worried About Programme researcher, broadcasting/film/video in the Last Year: Not on file  . Ran Out of Food in the Last Year: Not on file  Transportation Needs:   . Lack of Transportation (Medical): Not on file  . Lack of Transportation (Non-Medical): Not on file  Physical Activity:   . Days of Exercise per Week: Not on file  . Minutes of Exercise per Session: Not on file  Stress:   . Feeling of Stress : Not on file  Social Connections:   . Frequency of Communication with Friends and Family: Not on file  . Frequency of Social Gatherings with Friends and Family: Not on file  . Attends Religious Services: Not on file  . Active Member of Clubs or Organizations: Not on file  .  Attends BankerClub or Organization Meetings: Not on file  . Marital Status: Not on file  Intimate Partner Violence:   . Fear of Current or Ex-Partner: Not on file  . Emotionally Abused: Not on file  . Physically Abused: Not on file  . Sexually Abused: Not on file    FAMILY HISTORY:  Family History  Problem Relation Age of Onset  . Healthy Mother   . Healthy Father   . Cerebral palsy Sister   . Healthy Brother     CURRENT MEDICATIONS:  Current Outpatient Medications  Medication Sig Dispense Refill  .  rivaroxaban (XARELTO) 20 MG TABS tablet TAKE 1 TABLET BY MOUTH DAILY WITH SUPPER. 30 tablet 3  . ergocalciferol (VITAMIN D2) 1.25 MG (50000 UT) capsule Take 1 capsule (50,000 Units total) by mouth once a week. 4 capsule 6   No current facility-administered medications for this visit.    ALLERGIES:  No Known Allergies  PHYSICAL EXAM:  Performance status (ECOG): 0 - Asymptomatic  Vitals:   11/06/19 1550  BP: 122/60  Pulse: 68  Resp: 18  Temp: (!) 96.9 F (36.1 C)  SpO2: 98%   Wt Readings from Last 3 Encounters:  11/06/19 103 lb 12.8 oz (47.1 kg)  05/07/19 101 lb 9.6 oz (46.1 kg) (5 %, Z= -1.67)*  02/04/19 95 lb (43.1 kg) (1 %, Z= -2.27)*   * Growth percentiles are based on CDC (Girls, 2-20 Years) data.   Physical Exam Vitals reviewed.  Constitutional:      Appearance: Normal appearance.  Cardiovascular:     Rate and Rhythm: Normal rate and regular rhythm.     Pulses: Normal pulses.     Heart sounds: Normal heart sounds.  Pulmonary:     Effort: Pulmonary effort is normal.     Breath sounds: Normal breath sounds.  Musculoskeletal:     Right lower leg: No tenderness or bony tenderness. Edema (R calf > L calf) present.     Left lower leg: No tenderness or bony tenderness. No edema.  Neurological:     General: No focal deficit present.     Mental Status: She is alert and oriented to person, place, and time.  Psychiatric:        Mood and Affect: Mood normal.        Behavior: Behavior normal.     LABORATORY DATA:  I have reviewed the labs as listed.  CBC Latest Ref Rng & Units 10/30/2019 04/28/2019 11/28/2018  WBC 4.0 - 10.5 K/uL 6.8 6.4 5.8  Hemoglobin 12.0 - 15.0 g/dL 16.112.5 11.9(L) 11.5(L)  Hematocrit 36 - 46 % 39.0 38.1 37.2  Platelets 150 - 400 K/uL 210 288 295   CMP Latest Ref Rng & Units 10/30/2019 04/28/2019 01/17/2019  Glucose 70 - 99 mg/dL 81 91 72  BUN 6 - 20 mg/dL 16 16 14   Creatinine 0.44 - 1.00 mg/dL 0.960.77 0.450.82 4.090.96  Sodium 135 - 145 mmol/L 135 137 144   Potassium 3.5 - 5.1 mmol/L 3.6 4.0 3.6  Chloride 98 - 111 mmol/L 104 101 108(H)  CO2 22 - 32 mmol/L 24 26 21   Calcium 8.9 - 10.3 mg/dL 8.9 9.4 9.5  Total Protein 6.5 - 8.1 g/dL 7.3 8.0 7.7  Total Bilirubin 0.3 - 1.2 mg/dL 0.4 0.4 0.5  Alkaline Phos 38 - 126 U/L 50 66 79  AST 15 - 41 U/L 17 21 24   ALT 0 - 44 U/L 17 19 20       Component Value Date/Time  RBC 4.39 10/30/2019 0934   MCV 88.8 10/30/2019 0934   MCV 77 (L) 01/19/2017 1122   MCH 28.5 10/30/2019 0934   MCHC 32.1 10/30/2019 0934   RDW 15.5 10/30/2019 0934   RDW 15.3 01/19/2017 1122   LYMPHSABS 2.5 10/30/2019 0934   LYMPHSABS 1.7 01/19/2017 1122   MONOABS 0.5 10/30/2019 0934   EOSABS 0.1 10/30/2019 0934   EOSABS 0.1 01/19/2017 1122   BASOSABS 0.0 10/30/2019 0934   BASOSABS 0.0 01/19/2017 1122   Lab Results  Component Value Date   LDH 119 10/30/2019   LDH 136 04/28/2019   LDH 136 11/28/2018   Lab Results  Component Value Date   VD25OH 18.25 (L) 10/30/2019   VD25OH 15.00 (L) 04/28/2019   VD25OH 13.23 (L) 11/28/2018   Lab Results  Component Value Date   TIBC 296 10/30/2019   TIBC 397 04/28/2019   TIBC 351 11/28/2018   FERRITIN 84 10/30/2019   FERRITIN 6 (L) 04/28/2019   FERRITIN 10 (L) 11/28/2018   IRONPCTSAT 19 10/30/2019   IRONPCTSAT 6 (L) 04/28/2019   IRONPCTSAT 17 11/28/2018    DIAGNOSTIC IMAGING:  I have independently reviewed the scans and discussed with the patient. No results found.   ASSESSMENT:  1.  Recurrent right leg DVT: -Doppler on 11/13/2017 shows extensive occlusive DVT extending from the right common femoral vein through proximal aspect of the right popliteal vein.  Patient was on Nexplanon (progestin contraceptive) at that time, which was implanted in June/July 2019.  This was later taken out. -Patient currently on Xarelto 20 mg and is tolerating it very well. -Initial testing for lupus anticoagulant was consistent with beta-2 IgG of 39 and IgA of 145.  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  Prothrombin gene mutation was negative.  Factor V Leiden mutation was negative. -Repeat testing on 03/27/2018 showed negative lupus anticoagulant.  Beta-2 glycoprotein IgG and IgM were normal.  IgA level decreased to 58. -Family history was significant for mother with recurrent DVT while she was pregnant. -As primary DVT was thought to be secondary to birth control pills, Xarelto was discontinued in mid April 2020.  In 2 weeks she developed leg swelling. -Right lower extremity Doppler on 05/29/2018 shows residual occlusive thrombus in the right common femoral vein.  More peripheral DVT seen previously has resolved. -CTAP showed DVT involving the right common and external and internal iliac veins as well as common femoral vein. -Mechanical thrombectomy of the right common external iliac veins, common femoral and femoral veins on 06/06/2018. -Doppler on 07/16/2018 shows continue DVT involving the right distal femoral vein, right popliteal vein. -Doppler on 02/03/2019 consistent with chronic DVT involving right popliteal vein, right femoral vein.  2.  Normocytic anemia: -This is likely from menstrual blood loss. -Patient could not tolerate iron tablet secondary to nausea. -Last Feraheme on 05/15/2019 on 05/23/2019.   PLAN:  1.  Recurrent right leg DVT: -First episode is provoked, second episode is unprovoked. -She is tolerating Xarelto very well. -She will continue Xarelto indefinitely at this time. -As Xarelto is not evaluated during pregnancy, I have recommended her to be careful about becoming pregnant while on Xarelto.  She will let us know if she plans to get pregnant so that we can think about alternatives.  2.  Normocytic anemia: -She has 7 days of menstrual bleeding, 3 to 4 days heavy. -Reviewed labs from 10/30/2019.  Ferritin is 84 after 2 infusions of Feraheme in April. -Hemoglobin is 12.5.  She cannot take iron pills due to  nausea. -We will plan to repeat her labs in 4  months.  3.  Vitamin D deficiency: -Vitamin D today is 18.25. -We will start her on vitamin D 50,000 units weekly.   Orders placed this encounter:  No orders of the defined types were placed in this encounter.    Doreatha Massed, MD [facility] [phone]   I, Drue Second, am acting as a scribe for Dr. Payton Mccallum.  I, Doreatha Massed MD, have reviewed the above documentation for accuracy and completeness, and I agree with the above.

## 2019-12-10 DIAGNOSIS — I82493 Acute embolism and thrombosis of other specified deep vein of lower extremity, bilateral: Secondary | ICD-10-CM | POA: Diagnosis not present

## 2019-12-16 ENCOUNTER — Encounter: Payer: Self-pay | Admitting: Emergency Medicine

## 2019-12-16 ENCOUNTER — Ambulatory Visit
Admission: EM | Admit: 2019-12-16 | Discharge: 2019-12-16 | Disposition: A | Discharge status: Home / Self Care | Payer: Medicaid Other | Attending: Emergency Medicine | Admitting: Emergency Medicine

## 2019-12-16 ENCOUNTER — Other Ambulatory Visit: Payer: Self-pay

## 2019-12-16 DIAGNOSIS — Z202 Contact with and (suspected) exposure to infections with a predominantly sexual mode of transmission: Secondary | ICD-10-CM | POA: Diagnosis not present

## 2019-12-16 NOTE — ED Provider Notes (Signed)
[facility] CARE CENTER   [id number] 12/16/19 Arrival Time: 1640   Chief Complaint  Patient presents with  . SEXUALLY TRANSMITTED DISEASE     SUBJECTIVE:  Nicole Hodge is a 19 y.o. female who presented to the urgent care for STD testing.  Patient is currently asymptomatic.Marland Kitchen  She denies a precipitating event, recent sexual encounter or recent antibiotic use.  Patient is sexually active with 1 female partner.  Recent sexual encounter within few days.  She denies fever, chills, nausea, vomiting, abdominal or pelvic pain, urinary symptoms, vaginal itching, vaginal odor, vaginal bleeding, dyspareunia, vaginal rashes or lesions.   No LMP recorded. Current birth control method: Compliant with BC:  ROS: As per HPI.  All other pertinent ROS negative.     Past Medical History:  Diagnosis Date  . DVT (deep venous thrombosis) (HCC)    Past Surgical History:  Procedure Laterality Date  . IVC VENOGRAPHY N/A 06/06/2018   Procedure: IVC Venography;  Surgeon: Maeola Harman, MD;  Location: [facility] INVASIVE CV LAB;  Service: Cardiovascular;  Laterality: N/A;  . PERIPHERAL VASCULAR THROMBECTOMY N/A 06/06/2018   Procedure: PERIPHERAL VASCULAR THROMBECTOMY;  Surgeon: Maeola Harman, MD;  Location: [facility] INVASIVE CV LAB;  Service: Cardiovascular;  Laterality: N/A;   No Known Allergies No current facility-administered medications on file prior to encounter.   Current Outpatient Medications on File Prior to Encounter  Medication Sig Dispense Refill  . ergocalciferol (VITAMIN D2) 1.25 MG (50000 UT) capsule Take 1 capsule (50,000 Units total) by mouth once a week. 4 capsule 6  . rivaroxaban (XARELTO) 20 MG TABS tablet TAKE 1 TABLET BY MOUTH DAILY WITH SUPPER. 30 tablet 3    Social History   Socioeconomic History  . Marital status: Single    Spouse name: Not on file  . Number of children: 0  . Years of education: Not on file  . Highest education level: Not on file  Occupational  History  . Not on file  Tobacco Use  . Smoking status: Never Smoker  . Smokeless tobacco: Never Used  Vaping Use  . Vaping Use: Never used  Substance and Sexual Activity  . Alcohol use: No  . Drug use: No  . Sexual activity: Yes    Birth control/protection: None  Other Topics Concern  . Not on file  Social History Narrative  . Not on file   Social Determinants of Health   Financial Resource Strain:   . Difficulty of Paying Living Expenses: Not on file  Food Insecurity:   . Worried About Programme researcher, broadcasting/film/video in the Last Year: Not on file  . Ran Out of Food in the Last Year: Not on file  Transportation Needs:   . Lack of Transportation (Medical): Not on file  . Lack of Transportation (Non-Medical): Not on file  Physical Activity:   . Days of Exercise per Week: Not on file  . Minutes of Exercise per Session: Not on file  Stress:   . Feeling of Stress : Not on file  Social Connections:   . Frequency of Communication with Friends and Family: Not on file  . Frequency of Social Gatherings with Friends and Family: Not on file  . Attends Religious Services: Not on file  . Active Member of Clubs or Organizations: Not on file  . Attends Banker Meetings: Not on file  . Marital Status: Not on file  Intimate Partner Violence:   . Fear of Current or Ex-Partner: Not on file  .  Emotionally Abused: Not on file  . Physically Abused: Not on file  . Sexually Abused: Not on file   Family History  Problem Relation Age of Onset  . Healthy Mother   . Healthy Father   . Cerebral palsy Sister   . Healthy Brother     OBJECTIVE:  Vitals:   12/16/19 1756 12/16/19 1757  BP: 100/60   Pulse: 91   Resp: 19   Temp: 98.2 F (36.8 C)   TempSrc: Oral   SpO2: 98%   Height:  5\' 2"  (1.575 m)     General appearance: Alert, NAD, appears stated age Head: NCAT Throat: lips, mucosa, and tongue normal; teeth and gums normal Lungs: CTA bilaterally without adventitious breath  sounds Heart: regular rate and rhythm.  Radial pulses 2+ symmetrical bilaterally Back: no CVA tenderness Abdomen: soft, non-tender; bowel sounds normal; no masses or organomegaly; no guarding or rebound tenderness GU: cervical self swab obtained Skin: warm and dry Psychological:  Alert and cooperative. Normal mood and affect.  LABS:  Results for orders placed or performed in visit on 10/30/19  Lactate dehydrogenase  Result Value Ref Range   LDH 119 98 - 192 U/L  CBC with Differential/Platelet  Result Value Ref Range   WBC 6.8 4.0 - 10.5 K/uL   RBC 4.39 3.87 - 5.11 MIL/uL   Hemoglobin 12.5 12.0 - 15.0 g/dL   HCT 11/01/19 36 - 46 %   MCV 88.8 80.0 - 100.0 fL   MCH 28.5 26.0 - 34.0 pg   MCHC 32.1 30.0 - 36.0 g/dL   RDW 16.1 09.6 - 04.5 %   Platelets 210 150 - 400 K/uL   nRBC 0.0 0.0 - 0.2 %   Neutrophils Relative % 56 %   Neutro Abs 3.8 1.7 - 7.7 K/uL   Lymphocytes Relative 36 %   Lymphs Abs 2.5 0.7 - 4.0 K/uL   Monocytes Relative 7 %   Monocytes Absolute 0.5 0.1 - 1.0 K/uL   Eosinophils Relative 1 %   Eosinophils Absolute 0.1 0.0 - 0.5 K/uL   Basophils Relative 0 %   Basophils Absolute 0.0 0.0 - 0.1 K/uL   Immature Granulocytes 0 %   Abs Immature Granulocytes 0.01 0.00 - 0.07 K/uL  Comprehensive metabolic panel  Result Value Ref Range   Sodium 135 135 - 145 mmol/L   Potassium 3.6 3.5 - 5.1 mmol/L   Chloride 104 98 - 111 mmol/L   CO2 24 22 - 32 mmol/L   Glucose, Bld 81 70 - 99 mg/dL   BUN 16 6 - 20 mg/dL   Creatinine, Ser 40.9 0.44 - 1.00 mg/dL   Calcium 8.9 8.9 - 8.11 mg/dL   Total Protein 7.3 6.5 - 8.1 g/dL   Albumin 4.3 3.5 - 5.0 g/dL   AST 17 15 - 41 U/L   ALT 17 0 - 44 U/L   Alkaline Phosphatase 50 38 - 126 U/L   Total Bilirubin 0.4 0.3 - 1.2 mg/dL   GFR calc non Af Amer >60 >60 mL/min   GFR calc Af Amer >60 >60 mL/min   Anion gap 7 5 - 15  Ferritin  Result Value Ref Range   Ferritin 84 11 - 307 ng/mL  Iron and TIBC  Result Value Ref Range   Iron 57 28 - 170  ug/dL   TIBC 91.4 782 - 956 ug/dL   Saturation Ratios 19 10.4 - 31.8 %   UIBC 239 ug/dL  Vitamin 213  Result Value Ref  Range   Vitamin B-12 510 180 - 914 pg/mL  VITAMIN D 25 Hydroxy (Vit-D Deficiency, Fractures)  Result Value Ref Range   Vit D, 25-Hydroxy 18.25 (L) 30 - 100 ng/mL  Folate  Result Value Ref Range   Folate 13.2 >5.9 ng/mL    Labs Reviewed  CERVICOVAGINAL ANCILLARY ONLY    ASSESSMENT & PLAN:  1. STD exposure     No orders of the defined types were placed in this encounter.   Pending: Labs Reviewed  CERVICOVAGINAL ANCILLARY ONLY    Discharge instructions Cervical swab was completed.  Someone will call you if your result is abnormal. If tests results are positive, please abstain from sexual activity until you and your partner(s) have been treated Follow up with PCP or [facility] if symptoms persists Return here or go to ER if you have any new or worsening symptoms fever, chills, nausea, vomiting, abdominal or pelvic pain, painful intercourse, vaginal discharge, vaginal bleeding, persistent symptoms despite treatment, etc...  Reviewed expectations re: course of current medical issues. Questions answered. Outlined signs and symptoms indicating need for more acute intervention. Patient verbalized understanding. After Visit Summary given.       Durward Parcel, FNP 12/16/19 1830

## 2019-12-16 NOTE — Discharge Instructions (Addendum)
Cervical swab was completed.  Someone will call you if your result is abnormal. If tests results are positive, please abstain from sexual activity until you and your partner(s) have been treated Follow up with PCP or [facility] if symptoms persists Return here or go to ER if you have any new or worsening symptoms fever, chills, nausea, vomiting, abdominal or pelvic pain, painful intercourse, vaginal discharge, vaginal bleeding, persistent symptoms despite treatment, etc..Marland Kitchen

## 2019-12-16 NOTE — ED Triage Notes (Signed)
Would like std check. Denies any s/s

## 2019-12-17 ENCOUNTER — Telehealth (HOSPITAL_COMMUNITY): Payer: Self-pay | Admitting: Emergency Medicine

## 2019-12-17 LAB — CERVICOVAGINAL ANCILLARY ONLY
Bacterial Vaginitis (gardnerella): POSITIVE — AB
Candida Glabrata: NEGATIVE
Candida Vaginitis: NEGATIVE
Chlamydia: NEGATIVE
Comment: NEGATIVE
Comment: NEGATIVE
Comment: NEGATIVE
Comment: NEGATIVE
Comment: NEGATIVE
Comment: NORMAL
Neisseria Gonorrhea: NEGATIVE
Trichomonas: POSITIVE — AB

## 2019-12-17 MED ORDER — METRONIDAZOLE 500 MG PO TABS: 500.0000 mg | ORAL_TABLET | Freq: Two times a day (BID) | ORAL | 0 refills | Status: DC

## 2020-01-07 ENCOUNTER — Telehealth: Payer: Self-pay

## 2020-01-07 NOTE — Telephone Encounter (Signed)
Pt called with questions about her Eliquis rx at [facility] not being filled each month in time. I spoke with Arline Asp at this pharmacy who stated pt's rx is ready for pickup and pt has rec'd rx each month. Pt has been made aware and has no further questions/concerns at this time.

## 2020-01-28 ENCOUNTER — Other Ambulatory Visit: Payer: Self-pay | Admitting: *Deleted

## 2020-01-28 DIAGNOSIS — I82421 Acute embolism and thrombosis of right iliac vein: Secondary | ICD-10-CM

## 2020-01-28 DIAGNOSIS — I82521 Chronic embolism and thrombosis of right iliac vein: Secondary | ICD-10-CM

## 2020-02-02 IMAGING — DX DG CHEST 2V
2 series · 2 of 2 positions shown · non-contrast
Comparison: 12/24/2017

CLINICAL DATA: Unintentional weight loss.

EXAM:
CHEST - 2 VIEW

[chest pa]
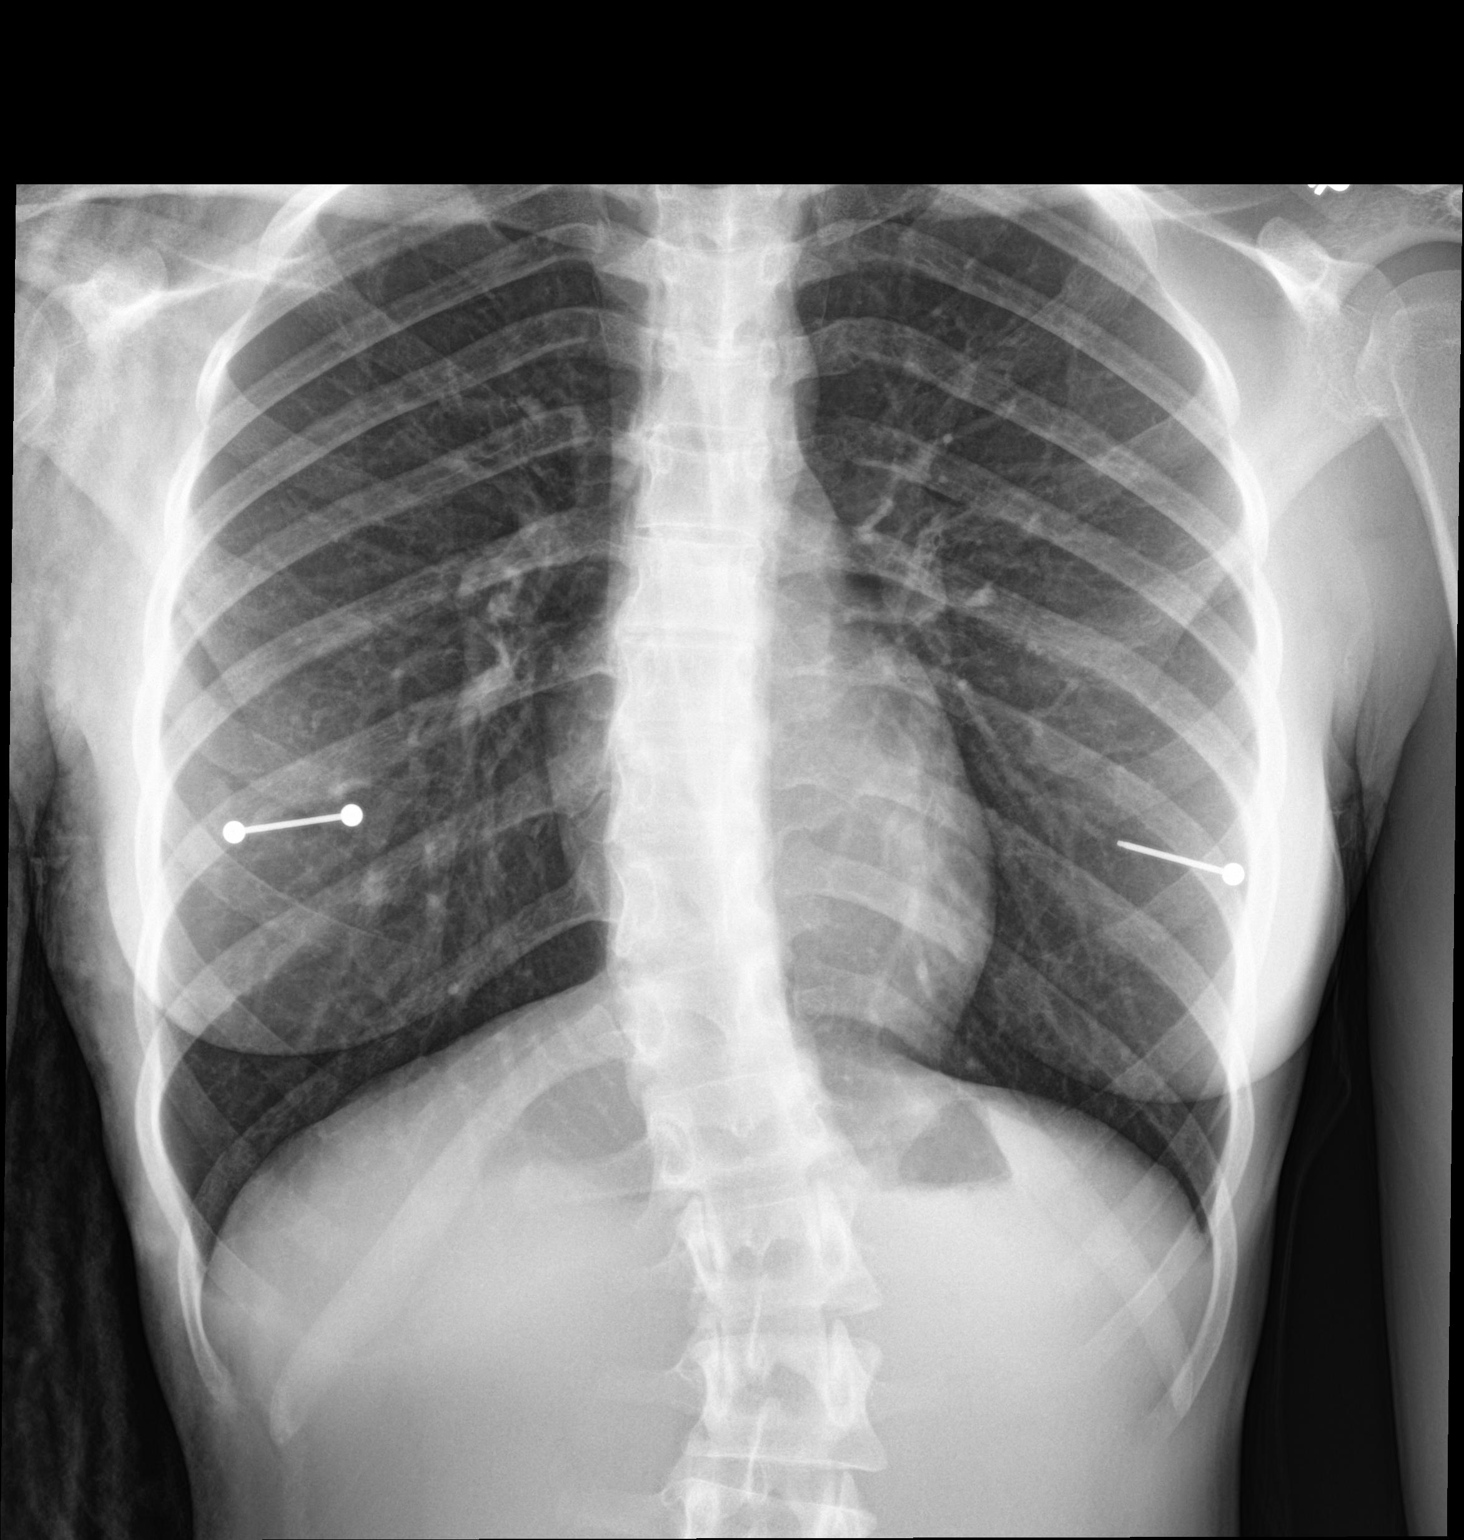

[chest lat]
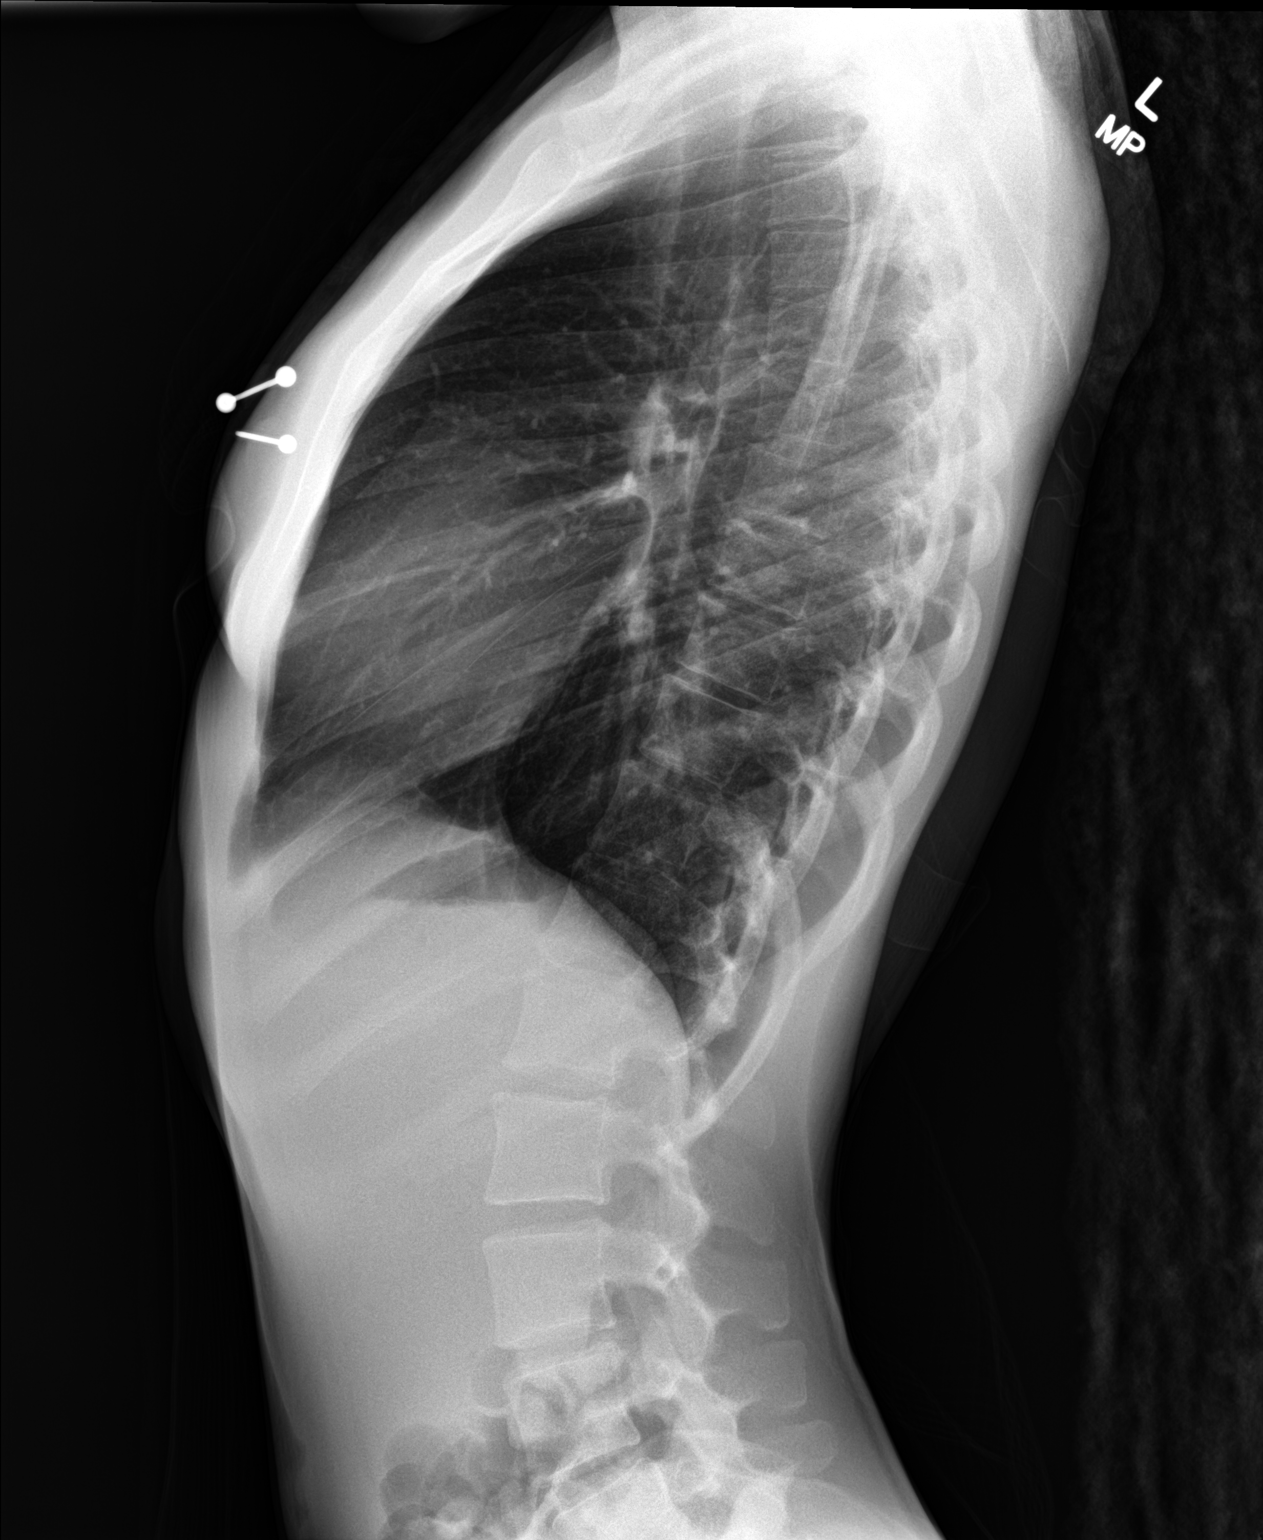

[2 of 2 positions shown; findings below may reference images not displayed]

FINDINGS: The heart size and mediastinal contours are within normal limits.
Both lungs are clear. Mild thoracolumbar scoliosis remains stable.
IMPRESSION: Stable exam.  No active cardiopulmonary disease.

## 2020-02-16 ENCOUNTER — Encounter (HOSPITAL_COMMUNITY): Payer: Medicaid Other

## 2020-02-16 ENCOUNTER — Ambulatory Visit: Payer: Medicaid Other

## 2020-02-25 ENCOUNTER — Ambulatory Visit: Payer: Medicaid Other

## 2020-02-25 ENCOUNTER — Encounter (HOSPITAL_COMMUNITY): Payer: Medicaid Other

## 2020-03-08 ENCOUNTER — Inpatient Hospital Stay (HOSPITAL_COMMUNITY): Payer: Medicaid Other | Attending: Hematology

## 2020-03-15 ENCOUNTER — Ambulatory Visit (HOSPITAL_COMMUNITY): Payer: Medicaid Other | Admitting: Hematology

## 2020-04-07 ENCOUNTER — Telehealth: Payer: Self-pay

## 2020-04-07 ENCOUNTER — Inpatient Hospital Stay (HOSPITAL_COMMUNITY): Payer: Medicaid Other | Attending: Hematology

## 2020-04-07 ENCOUNTER — Other Ambulatory Visit: Payer: Self-pay

## 2020-04-07 DIAGNOSIS — E559 Vitamin D deficiency, unspecified: Secondary | ICD-10-CM | POA: Diagnosis not present

## 2020-04-07 DIAGNOSIS — I82411 Acute embolism and thrombosis of right femoral vein: Secondary | ICD-10-CM | POA: Diagnosis not present

## 2020-04-07 LAB — IRON AND TIBC
Iron: 46 ug/dL (ref 28–170)
Saturation Ratios: 19 % (ref 10.4–31.8)
TIBC: 238 ug/dL — ABNORMAL LOW (ref 250–450)
UIBC: 192 ug/dL

## 2020-04-07 LAB — CBC WITH DIFFERENTIAL/PLATELET
Abs Immature Granulocytes: 0.02 10*3/uL (ref 0.00–0.07)
Basophils Absolute: 0 10*3/uL (ref 0.0–0.1)
Basophils Relative: 0 %
Eosinophils Absolute: 0.2 10*3/uL (ref 0.0–0.5)
Eosinophils Relative: 3 %
HCT: 39 % (ref 36.0–46.0)
Hemoglobin: 12.5 g/dL (ref 12.0–15.0)
Immature Granulocytes: 0 %
Lymphocytes Relative: 39 %
Lymphs Abs: 2.3 10*3/uL (ref 0.7–4.0)
MCH: 28.5 pg (ref 26.0–34.0)
MCHC: 32.1 g/dL (ref 30.0–36.0)
MCV: 88.8 fL (ref 80.0–100.0)
Monocytes Absolute: 0.4 10*3/uL (ref 0.1–1.0)
Monocytes Relative: 6 %
Neutro Abs: 3 10*3/uL (ref 1.7–7.7)
Neutrophils Relative %: 52 %
Platelets: 204 10*3/uL (ref 150–400)
RBC: 4.39 MIL/uL (ref 3.87–5.11)
RDW: 15 % (ref 11.5–15.5)
WBC: 5.8 10*3/uL (ref 4.0–10.5)
nRBC: 0 % (ref 0.0–0.2)

## 2020-04-07 LAB — VITAMIN D 25 HYDROXY (VIT D DEFICIENCY, FRACTURES): Vit D, 25-Hydroxy: 23.55 ng/mL — ABNORMAL LOW (ref 30–100)

## 2020-04-07 LAB — D-DIMER, QUANTITATIVE: D-Dimer, Quant: 1.67 ug/mL-FEU — ABNORMAL HIGH (ref 0.00–0.50)

## 2020-04-07 LAB — FERRITIN: Ferritin: 65 ng/mL (ref 11–307)

## 2020-04-07 NOTE — Telephone Encounter (Signed)
Patient reports waiting over a month for [facility] to fill her Xarelto prescription and that they said they were waiting on [place]. Never received communication from pharmacy that refills were needed. Called [facility] and gave verbal script for xarelto 1 qd w/supper #30 with 12 refills. Patient made aware. She has follow up appt with [facility] in April.

## 2020-04-12 NOTE — Progress Notes (Deleted)
[facility] [address], [place] [address]   CLINIC:  Medical Oncology/Hematology  PCP:  Nicole Spencer, FNP [address] / [place] [address]  [phone]  REASON FOR VISIT:  Follow-up for right leg DVT and IDA  PRIOR THERAPY: Iron tablets daily  CURRENT THERAPY: Xarelto daily; intermittent Feraheme  INTERVAL HISTORY:  [name], a 20 y.o. female, returns for routine follow-up for her right leg DVT and IDA. Nicole Hodge was last seen on 11/06/19. She received 2 iron infusions on 05/15/2019 and 05/23/2019.  Today she reports that she developed another blood clot 2 weeks after stopping Xarelto the first time. She is tolerating the Xarelto well without any issues. She stopped taking the iron tablet and is getting iron infusions intermittently. She reports that her right knee is still mildly swollen, but denies knee pain, dyspnea or SOB. She continues menstruating and sometimes gets heavy bleeding; her periods last 7 days with 3-4 days of heavy bleeding.   REVIEW OF SYSTEMS:  Review of Systems  Constitutional: Positive for appetite change (75%) and fatigue (75%).  Respiratory: Negative for shortness of breath.   Cardiovascular: Positive for leg swelling (R knee swelling).  Musculoskeletal: Negative for arthralgias.  Psychiatric/Behavioral: Positive for sleep disturbance.  All other systems reviewed and are negative.   PAST MEDICAL/SURGICAL HISTORY:  Past Medical History:  Diagnosis Date  . DVT (deep venous thrombosis) (HCC)    Past Surgical History:  Procedure Laterality Date  . IVC VENOGRAPHY N/A 06/06/2018   Procedure: IVC Venography;  Surgeon: Maeola Harman, MD;  Location: [facility] INVASIVE CV LAB;  Service: Cardiovascular;  Laterality: N/A;  . PERIPHERAL VASCULAR THROMBECTOMY N/A 06/06/2018   Procedure: PERIPHERAL VASCULAR THROMBECTOMY;  Surgeon: Maeola Harman, MD;  Location: [facility] INVASIVE CV LAB;  Service: Cardiovascular;   Laterality: N/A;    SOCIAL HISTORY:  Social History   Socioeconomic History  . Marital status: Single    Spouse name: Not on file  . Number of children: 0  . Years of education: Not on file  . Highest education level: Not on file  Occupational History  . Not on file  Tobacco Use  . Smoking status: Never Smoker  . Smokeless tobacco: Never Used  Vaping Use  . Vaping Use: Never used  Substance and Sexual Activity  . Alcohol use: No  . Drug use: No  . Sexual activity: Yes    Birth control/protection: None  Other Topics Concern  . Not on file  Social History Narrative  . Not on file   Social Determinants of Health   Financial Resource Strain: Not on file  Food Insecurity: Not on file  Transportation Needs: Not on file  Physical Activity: Not on file  Stress: Not on file  Social Connections: Not on file  Intimate Partner Violence: Not on file    FAMILY HISTORY:  Family History  Problem Relation Age of Onset  . Healthy Mother   . Healthy Father   . Cerebral palsy Sister   . Healthy Brother     CURRENT MEDICATIONS:  Current Outpatient Medications  Medication Sig Dispense Refill  . ergocalciferol (VITAMIN D2) 1.25 MG (50000 UT) capsule Take 1 capsule (50,000 Units total) by mouth once a week. 4 capsule 6  . metroNIDAZOLE (FLAGYL) 500 MG tablet Take 1 tablet (500 mg total) by mouth 2 (two) times daily. 14 tablet 0  . rivaroxaban (XARELTO) 20 MG TABS tablet TAKE 1 TABLET BY MOUTH DAILY  WITH SUPPER. 30 tablet 3   No current facility-administered medications for this visit.    ALLERGIES:  No Known Allergies  PHYSICAL EXAM:  Performance status (ECOG): 0 - Asymptomatic  There were no vitals filed for this visit. Wt Readings from Last 3 Encounters:  11/06/19 103 lb 12.8 oz (47.1 kg)  05/07/19 101 lb 9.6 oz (46.1 kg) (5 %, Z= -1.67)*  02/04/19 95 lb (43.1 kg) (1 %, Z= -2.27)*   * Growth percentiles are based on CDC (Girls, 2-20 Years) data.   Physical  Exam Vitals reviewed.  Constitutional:      Appearance: Normal appearance.  Cardiovascular:     Rate and Rhythm: Normal rate and regular rhythm.     Pulses: Normal pulses.     Heart sounds: Normal heart sounds.  Pulmonary:     Effort: Pulmonary effort is normal.     Breath sounds: Normal breath sounds.  Musculoskeletal:     Right lower leg: No tenderness or bony tenderness. Edema (R calf > L calf) present.     Left lower leg: No tenderness or bony tenderness. No edema.  Neurological:     General: No focal deficit present.     Mental Status: She is alert and oriented to person, place, and time.  Psychiatric:        Mood and Affect: Mood normal.        Behavior: Behavior normal.     LABORATORY DATA:  I have reviewed the labs as listed.  CBC Latest Ref Rng & Units 04/07/2020 10/30/2019 04/28/2019  WBC 4.0 - 10.5 K/uL 5.8 6.8 6.4  Hemoglobin 12.0 - 15.0 g/dL 96.2 22.9 11.9(L)  Hematocrit 36.0 - 46.0 % 39.0 39.0 38.1  Platelets 150 - 400 K/uL 204 210 288   CMP Latest Ref Rng & Units 10/30/2019 04/28/2019 01/17/2019  Glucose 70 - 99 mg/dL 81 91 72  BUN 6 - 20 mg/dL 16 16 14   Creatinine 0.44 - 1.00 mg/dL 7.98 9.21  Sodium 135 - 145 mmol/L 135 137 144  Potassium 3.5 - 5.1 mmol/L 3.6 4.0 3.6  Chloride 98 - 111 mmol/L 104 101 108(H)  CO2 22 - 32 mmol/L 24 26 21   Calcium 8.9 - 10.3 mg/dL 8.9 9.4 9.5  Total Protein 6.5 - 8.1 g/dL 7.3 8.0 7.7  Total Bilirubin 0.3 - 1.2 mg/dL 0.4 0.4 0.5  Alkaline Phos 38 - 126 U/L 50 66 79  AST 15 - 41 U/L 17 21 24   ALT 0 - 44 U/L 17 19 20       Component Value Date/Time   RBC 4.39 04/07/2020 1034   MCV 88.8 04/07/2020 1034   MCV 77 (L) 01/19/2017 1122   MCH 28.5 04/07/2020 1034   MCHC 32.1 04/07/2020 1034   RDW 15.0 04/07/2020 1034   RDW 15.3 01/19/2017 1122   LYMPHSABS 2.3 04/07/2020 1034   LYMPHSABS 1.7 01/19/2017 1122   MONOABS 0.4 04/07/2020 1034   EOSABS 0.2 04/07/2020 1034   EOSABS 0.1 01/19/2017 1122   BASOSABS 0.0 04/07/2020 1034    BASOSABS 0.0 01/19/2017 1122   Lab Results  Component Value Date   LDH 119 10/30/2019   LDH 136 04/28/2019   LDH 136 11/28/2018   Lab Results  Component Value Date   VD25OH 23.55 (L) 04/07/2020   VD25OH 18.25 (L) 10/30/2019   VD25OH 15.00 (L) 04/28/2019   Lab Results  Component Value Date   TIBC 238 (L) 04/07/2020   TIBC 296 10/30/2019   TIBC 397  04/28/2019   FERRITIN 65 04/07/2020   FERRITIN 84 10/30/2019   FERRITIN 6 (L) 04/28/2019   IRONPCTSAT 19 04/07/2020   IRONPCTSAT 19 10/30/2019   IRONPCTSAT 6 (L) 04/28/2019    DIAGNOSTIC IMAGING:  I have independently reviewed the scans and discussed with the patient. No results found.   ASSESSMENT:  1.  Recurrent right leg DVT: -Doppler on 11/13/2017 shows extensive occlusive DVT extending from the right common femoral vein through proximal aspect of the right popliteal vein.  Patient was on Nexplanon (progestin contraceptive) at that time, which was implanted in June/July 2019.  This was later taken out. -Patient currently on Xarelto 20 mg and is tolerating it very well. -Initial testing for lupus anticoagulant was consistent with beta-2 IgG of 39 and IgA of 145.  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  Prothrombin gene mutation was negative.  Factor V Leiden mutation was negative. -Repeat testing on 03/27/2018 showed negative lupus anticoagulant.  Beta-2 glycoprotein IgG and IgM were normal.  IgA level decreased to 58. -Family history was significant for mother with recurrent DVT while she was pregnant. -As primary DVT was thought to be secondary to birth control pills, Xarelto was discontinued in mid April 2020.  In 2 weeks she developed leg swelling. -Right lower extremity Doppler on 05/29/2018 shows residual occlusive thrombus in the right common femoral vein.  More peripheral DVT seen previously has resolved. -CTAP showed DVT involving the right common and external and internal iliac veins as well as common femoral  vein. -Mechanical thrombectomy of the right common external iliac veins, common femoral and femoral veins on 06/06/2018. -Doppler on 07/16/2018 shows continue DVT involving the right distal femoral vein, right popliteal vein. -Doppler on 02/03/2019 consistent with chronic DVT involving right popliteal vein, right femoral vein.  2.  Normocytic anemia: -This is likely from menstrual blood loss. -Patient could not tolerate iron tablet secondary to nausea. -Last Feraheme on 05/15/2019 on 05/23/2019.   PLAN:  1.  Recurrent right leg DVT: -First episode is provoked, second episode is unprovoked. -She is tolerating Xarelto very well. -She will continue Xarelto indefinitely at this time. -D-dimer from 04/07/2020 was elevated at 1.67. -As Xarelto is not evaluated during pregnancy, I have recommended her to be careful about becoming pregnant while on Xarelto.  She will let us know if she plans to get pregnant so that we can think about alternatives.  2.  Normocytic anemia: -She has 7 days of menstrual bleeding, 3 to 4 days heavy. -Labs from 04/07/2020 show ferritin of 84, hemoglobin 12.5 and iron saturations of 19%. -She last received IV Feraheme on 05/15/2019 and 05/23/2019. -She has stopped taking iron supplements secondary to nausea. --Plan to repeat labs in approximately 4 months.  3.  Vitamin D deficiency: -Vitamin D today is 18.25. -We will start her on vitamin D 50,000 units weekly.  Disposition: -No additional IV iron needed at this time. -Continue Xarelto.  Orders placed this encounter:  No orders of the defined types were placed in this encounter.    Doreatha Massed, MD [facility] [phone]   I, Drue Second, am acting as a scribe for Dr. Payton Mccallum.  I, Doreatha Massed MD, have reviewed the above documentation for accuracy and completeness, and I agree with the above.

## 2020-04-13 ENCOUNTER — Ambulatory Visit (HOSPITAL_COMMUNITY): Payer: Medicaid Other | Admitting: Oncology

## 2020-04-14 ENCOUNTER — Encounter (HOSPITAL_COMMUNITY): Payer: Self-pay | Admitting: *Deleted

## 2020-04-16 ENCOUNTER — Encounter (HOSPITAL_COMMUNITY): Payer: Self-pay | Admitting: *Deleted

## 2020-04-16 NOTE — Progress Notes (Signed)
This is late entry by leadership: The events took place on 04/14/20 in our clinic.    This was brought to my attention today after the events occured. Patient showed up in our department at 1030 today 3/16 for her appt. She was advised by our registrar that her appt was yesterday 3/15 at 0830 and she would need to reschedule. Patient was very rude with her as she was asked to step down to scheduling. Our scheduler attempted to give patient numerous appointments and they were too early, too late, not the correct day. During this time, patient called someone on her phone. Our scheduler, spoke to the patient and she said "I'm not talking to you, I'm on the phone" Our scheduler advised patient, "when you get off the phone, I will be glad to get you scheduled". Patient continued to be rude to her until they got her appt scheduled for 4/7. After patient left the department, the main phone number rang (caller ID stated it was Ms. Pandya)and when staff picked up the phone, she said " I hope you fucking die" and then hung up. Our staff was very shook by this and came to me immediately. We notified security and advised that to our knowledge she hasn't acted like this before.  Our physician notified and he directed patient to be discharged from the practice.    Letter of dismissal will be mailed to the patient.

## 2020-05-01 ENCOUNTER — Other Ambulatory Visit: Payer: Self-pay

## 2020-05-01 DIAGNOSIS — I82421 Acute embolism and thrombosis of right iliac vein: Secondary | ICD-10-CM

## 2020-05-06 ENCOUNTER — Inpatient Hospital Stay (HOSPITAL_COMMUNITY): Payer: Medicaid Other | Attending: Hematology | Admitting: Hematology

## 2020-05-18 ENCOUNTER — Ambulatory Visit (HOSPITAL_COMMUNITY)
Admission: RE | Admit: 2020-05-18 | Discharge: 2020-05-18 | Disposition: A | Discharge status: Home / Self Care | Payer: Medicaid Other | Source: Ambulatory Visit | Attending: Vascular Surgery | Admitting: Vascular Surgery

## 2020-05-18 ENCOUNTER — Other Ambulatory Visit: Payer: Self-pay

## 2020-05-18 ENCOUNTER — Ambulatory Visit (INDEPENDENT_AMBULATORY_CARE_PROVIDER_SITE_OTHER)
Admission: RE | Admit: 2020-05-18 | Discharge: 2020-05-18 | Disposition: A | Discharge status: Home / Self Care | Payer: Medicaid Other | Source: Ambulatory Visit | Attending: Vascular Surgery | Admitting: Vascular Surgery

## 2020-05-18 ENCOUNTER — Encounter (HOSPITAL_COMMUNITY): Payer: Medicaid Other

## 2020-05-18 ENCOUNTER — Ambulatory Visit (INDEPENDENT_AMBULATORY_CARE_PROVIDER_SITE_OTHER): Payer: Medicaid Other | Admitting: Physician Assistant

## 2020-05-18 ENCOUNTER — Other Ambulatory Visit: Payer: Self-pay | Admitting: Vascular Surgery

## 2020-05-18 VITALS — BP 114/63 | HR 64 | Temp 98.4°F | Resp 20 | Ht 62.0 in | Wt 107.1 lb

## 2020-05-18 DIAGNOSIS — I82421 Acute embolism and thrombosis of right iliac vein: Secondary | ICD-10-CM

## 2020-05-18 NOTE — Progress Notes (Signed)
  POST OPERATIVE OFFICE NOTE    CC:  F/u for surgery  HPI:  This is a 20 y.o. 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  She underwent mechanical thrombectomy of R common iliac, external iliac, and common femoral vein with Inari Clottriever by Dr. Chestine Spore in May of 2020. Stenting not performed due to young age and family planning.  She has occasional RLE heaviness when standing for periods of time and he continues to use her thigh-high compression stocking.  At her virtual visit in January 2021, her symptoms were improved and her venous duplex was stable as compared to her previous venogram.  She is compliant with rivaroxaban 20 mg daily.  She is otherwise healthy without chronic medical conditions. Not diabetic, no history of hypertension. No tobacco.  No Known Allergies  Current Outpatient Medications  Medication Sig Dispense Refill  . ergocalciferol (VITAMIN D2) 1.25 MG (50000 UT) capsule Take 1 capsule (50,000 Units total) by mouth once a week. 4 capsule 6  . metroNIDAZOLE (FLAGYL) 500 MG tablet Take 1 tablet (500 mg total) by mouth 2 (two) times daily. 14 tablet 0  . rivaroxaban (XARELTO) 20 MG TABS tablet TAKE 1 TABLET BY MOUTH DAILY WITH SUPPER. 30 tablet 3   No current facility-administered medications for this visit.     ROS:  See HPI Vitals:   05/18/20 0858  BP: 114/63  Pulse: 64  Resp: 20  Temp: 98.4 F (36.9 C)  SpO2: 100%    Physical Exam:  General appearance: Well-developed, well-nourished in no apparent distress Cardiac: Heart rate and rhythm are regular Respiratory: Nonlabored Extremities: 2+ bilateral DP pulses.  No edema.  No cyanosis or skin changes. Neuro: Alert and oriented x 4  05/18/2020 Venous duplex: IVC/Iliac: Patent IVC, right CIV, and right EIV.   RIGHT:  - Findings consistent with chronic deep vein thrombosis involving the  right popliteal vein.  - There is no evidence of acute deep vein thrombosis in  the lower  extremity.  - There is no evidence of superficial venous thrombosis.     Assessment/Plan:  This is a 20 y.o. female who is s/p: mechanical thrombectomy of R common iliac, external iliac, and common femoral vein with Inari Clottriever by Dr. Chestine Spore in May of 2020.  Noninvasive imaging of her right common iliac, external iliac and common femoral veins are all patent with no evidence of thrombus.  She does have some residual thrombus involving the right popliteal vein.  She states she has chronic right lower extremity edema but none on physical examination today.  Recommend continuing to use her compression stocking particularly at work as she stands most of the day.  She previously presented with acute on chronic DVT of her right lower extremity likely consistent with a right-sided May Thurner.  She ultimately underwent mechanical thrombectomy and her right common iliac, external iliac and common femoral veins are these are all open on duplex today.  Dr. Chestine Spore discussed with her previously that she will need to remain on anticoagulation until she is done with childbirth given that we did not stent her suspected May Thurner.  She can undergo stenting in the future when she is no longer planning on having anymore children.  Follow-up in 1 year with IVC/iliac venous duplex and right lower extremity venous duplex.  Wendi Maya, PA-C [facility] [phone]  Clinic MD:  Lenell Antu

## 2020-10-05 ENCOUNTER — Encounter (HOSPITAL_COMMUNITY): Payer: Self-pay | Admitting: Hematology

## 2020-10-07 ENCOUNTER — Other Ambulatory Visit: Payer: Self-pay

## 2020-10-07 ENCOUNTER — Encounter (HOSPITAL_COMMUNITY): Payer: Self-pay | Admitting: Hematology

## 2020-10-07 ENCOUNTER — Encounter: Payer: Self-pay | Admitting: Family Medicine

## 2020-10-07 ENCOUNTER — Ambulatory Visit (INDEPENDENT_AMBULATORY_CARE_PROVIDER_SITE_OTHER): Payer: Medicaid Other | Admitting: Family Medicine

## 2020-10-07 VITALS — BP 127/79 | HR 82 | Temp 98.0°F | Ht 62.0 in | Wt 99.8 lb

## 2020-10-07 DIAGNOSIS — L0231 Cutaneous abscess of buttock: Secondary | ICD-10-CM | POA: Diagnosis not present

## 2020-10-07 MED ORDER — SULFAMETHOXAZOLE-TRIMETHOPRIM 800-160 MG PO TABS: 1.0000 | ORAL_TABLET | Freq: Two times a day (BID) | ORAL | 0 refills | Status: AC

## 2020-10-07 NOTE — Progress Notes (Signed)
   Assessment & Plan:  1. Abscess of left buttock - printed education provided - sulfamethoxazole-trimethoprim (BACTRIM DS) 800-160 MG tablet; Take 1 tablet by mouth 2 (two) times daily for 7 days.  Dispense: 14 tablet; Refill: 0  2. Weight loss - her labs were normal last work up, she declined a referral to endocrinology  Follow up plan: Return if symptoms worsen or fail to improve.  Floy Sabina, NP Student  I personally was present during the history, physical exam, and medical decision-making activities of this service and have verified that the service and findings are accurately documented in the nurse practitioner student's note.  Deliah Boston, MSN, APRN, FNP-C [facility]   Subjective:   Patient ID: Nicole Hodge, female    DOB: August 18, 2000, 20 y.o.   MRN: [id number]  HPI: Nicole Hodge is a 20 y.o. female presenting on 10/07/2020 for Recurrent Skin Infections (Patient thinks she has a boil on the top of her bottom area x 1 week but has grew in size the last 2-3 days.)   She has had this boil on her left buttocks that has gotten progressively larger over the last 2-3 days. She has tried warm compresses to help with pain.   She is concerned about her weight. She was seen 2 years ago for sudden weight loss and was negative on her lab work for thyroid abnormalities. She was offered a referral to endocrinology but declined. She had been increasing her intake but is still losing weight compared to her baseline of 119 lbs.   ROS: Negative unless specifically indicated above in HPI.   Relevant past medical history reviewed and updated as indicated.   Allergies and medications reviewed and updated.   Current Outpatient Medications:    rivaroxaban (XARELTO) 20 MG TABS tablet, TAKE 1 TABLET BY MOUTH DAILY WITH SUPPER., Disp: 30 tablet, Rfl: 3   sulfamethoxazole-trimethoprim (BACTRIM DS) 800-160 MG tablet, Take 1 tablet by mouth 2 (two) times daily for 7  days., Disp: 14 tablet, Rfl: 0  No Known Allergies  Objective:   BP 127/79   Pulse 82   Temp 98 F (36.7 C) (Temporal)   Ht 5\' 2"  (1.575 m)   Wt 45.3 kg   BMI 18.25 kg/m    Physical Exam Constitutional:      General: She is not in acute distress.    Appearance: Normal appearance. She is underweight. She is not ill-appearing, toxic-appearing or diaphoretic.  HENT:     Head: Normocephalic and atraumatic.  Eyes:     Pupils: Pupils are equal, round, and reactive to light.  Pulmonary:     Effort: Pulmonary effort is normal.  Abdominal:     General: Abdomen is flat.     Palpations: Abdomen is soft.  Musculoskeletal:        General: Normal range of motion.     Cervical back: Normal range of motion.  Skin:    General: Skin is warm and dry.     Capillary Refill: Capillary refill takes less than 2 seconds.     Findings: Abscess (left buttock - no warmth, erythema, or drainage) present.  Neurological:     General: No focal deficit present.     Mental Status: She is alert and oriented to person, place, and time.

## 2020-10-13 ENCOUNTER — Ambulatory Visit (HOSPITAL_COMMUNITY): Payer: Medicaid Other | Admitting: Physician Assistant

## 2021-04-15 ENCOUNTER — Encounter: Payer: Self-pay | Admitting: Family

## 2021-04-15 ENCOUNTER — Ambulatory Visit: Payer: Medicaid Other | Admitting: Family

## 2021-04-18 ENCOUNTER — Other Ambulatory Visit: Payer: Self-pay

## 2021-04-18 DIAGNOSIS — I82421 Acute embolism and thrombosis of right iliac vein: Secondary | ICD-10-CM

## 2021-04-18 MED ORDER — RIVAROXABAN 20 MG PO TABS: ORAL_TABLET | ORAL | 1 refills | Status: DC

## 2021-04-18 NOTE — Telephone Encounter (Addendum)
Patient called stating she needs a refill on Xarelto. Per last OV, Patient should remain on Xarelto until her next visit schedule in 1 year. I schedule pt for her yearly appt and sent in a prescription for 1 month with 1 additional refill until she is seen. Patient provided with verbal fasting instructions for her appt. She voiced her understanding.  ?

## 2021-04-29 ENCOUNTER — Ambulatory Visit (INDEPENDENT_AMBULATORY_CARE_PROVIDER_SITE_OTHER): Payer: Medicaid Other | Admitting: Family

## 2021-04-29 ENCOUNTER — Encounter: Payer: Self-pay | Admitting: Family

## 2021-04-29 VITALS — BP 105/62 | HR 71 | Temp 98.0°F | Ht 62.0 in | Wt 105.6 lb

## 2021-04-29 DIAGNOSIS — Z0001 Encounter for general adult medical examination with abnormal findings: Secondary | ICD-10-CM | POA: Diagnosis not present

## 2021-04-29 DIAGNOSIS — Z0000 Encounter for general adult medical examination without abnormal findings: Secondary | ICD-10-CM

## 2021-04-29 DIAGNOSIS — R636 Underweight: Secondary | ICD-10-CM | POA: Diagnosis not present

## 2021-04-29 DIAGNOSIS — Z23 Encounter for immunization: Secondary | ICD-10-CM

## 2021-04-29 DIAGNOSIS — D500 Iron deficiency anemia secondary to blood loss (chronic): Secondary | ICD-10-CM

## 2021-04-29 DIAGNOSIS — Z136 Encounter for screening for cardiovascular disorders: Secondary | ICD-10-CM | POA: Diagnosis not present

## 2021-04-29 NOTE — Progress Notes (Signed)
? ?Subjective:  ? ? Patient ID: Dario Guardian, female    DOB: Jul 01, 2000, 21 y.o.   MRN: [id number] ? ?Chief Complaint  ?Patient presents with  ? Follow-up  ?  Patient is trying to gain weight needs some help  ? ?Pt presents to the office today for CPE without pap. She is followed by hematologists for hx of DVT and taking  Xarelto daily.  She is trying to gain weight and has started going to the gym to try to gain muscle. She is eating 1-2 meals a day with protein shakes and granola bars. She does not keep up with her calorie intake.  ?Anemia ?Presents for follow-up visit. There has been no confusion, fever or malaise/fatigue.  ? ? ? ?Review of Systems  ?Constitutional:  Negative for fever and malaise/fatigue.  ?Psychiatric/Behavioral:  Negative for confusion.   ?All other systems reviewed and are negative. ? ?Family History  ?Problem Relation Age of Onset  ? Healthy Mother   ? Healthy Father   ? Cerebral palsy Sister   ? Healthy Brother   ? ?Social History  ? ?Socioeconomic History  ? Marital status: Single  ?  Spouse name: Not on file  ? Number of children: 0  ? Years of education: Not on file  ? Highest education level: Not on file  ?Occupational History  ? Not on file  ?Tobacco Use  ? Smoking status: Never  ? Smokeless tobacco: Never  ?Vaping Use  ? Vaping Use: Never used  ?Substance and Sexual Activity  ? Alcohol use: No  ? Drug use: Yes  ?  Types: Marijuana  ? Sexual activity: Yes  ?  Birth control/protection: None  ?Other Topics Concern  ? Not on file  ?Social History Narrative  ? Not on file  ? ?Social Determinants of Health  ? ?Financial Resource Strain: Not on file  ?Food Insecurity: Not on file  ?Transportation Needs: Not on file  ?Physical Activity: Not on file  ?Stress: Not on file  ?Social Connections: Not on file  ? ? ?   ?Objective:  ? Physical Exam ?Vitals reviewed.  ?Constitutional:   ?   General: She is not in acute distress. ?   Appearance: She is well-developed.  ?HENT:  ?   Head: Normocephalic  and atraumatic.  ?   Right Ear: Tympanic membrane normal.  ?   Left Ear: Tympanic membrane normal.  ?Eyes:  ?   Pupils: Pupils are equal, round, and reactive to light.  ?Neck:  ?   Thyroid: No thyromegaly.  ?Cardiovascular:  ?   Rate and Rhythm: Normal rate and regular rhythm.  ?   Heart sounds: Normal heart sounds. No murmur heard. ?Pulmonary:  ?   Effort: Pulmonary effort is normal. No respiratory distress.  ?   Breath sounds: Normal breath sounds. No wheezing.  ?Abdominal:  ?   General: Bowel sounds are normal. There is no distension.  ?   Palpations: Abdomen is soft.  ?   Tenderness: There is no abdominal tenderness.  ?Musculoskeletal:     ?   General: No tenderness. Normal range of motion.  ?   Cervical back: Normal range of motion and neck supple.  ?Skin: ?   General: Skin is warm and dry.  ?Neurological:  ?   Mental Status: She is alert and oriented to person, place, and time.  ?   Cranial Nerves: No cranial nerve deficit.  ?   Deep Tendon Reflexes: Reflexes are normal and symmetric.  ?  Psychiatric:     ?   Behavior: Behavior normal.     ?   Thought Content: Thought content normal.     ?   Judgment: Judgment normal.  ? ? ? ? ?BP 105/62   Pulse 71   Temp 98 ?F (36.7 ?C) (Temporal)   Ht _0  (1.575 m)   Wt 105 lb 9.6 oz (47.9 kg)   BMI 19.31 kg/m?  ? ?   ?Assessment & Plan:  ?ASHERAH LAVOY comes in today with chief complaint of Follow-up (Patient is trying to gain weight needs some help) ? ? ?Diagnosis and orders addressed: ? ?1. Annual physical exam ?- CMP14+EGFR ?- Lipid panel ?- TSH ?- Anemia Profile B ? ?2. Underweight ?-Discussed high protein diet ?She will download My [employer] pal and keep up with calories, increase calories. ? ?3. Iron deficiency anemia due to chronic blood loss ? ? ? ?Labs pending ?Health Maintenance reviewed ?Diet and exercise encouraged ? ?Follow up plan: ?1 year  ? ? ?Evelina Dun, FNP ? ? ? ?

## 2021-04-29 NOTE — Patient Instructions (Signed)
High-Protein and High-Calorie Diet Eating high-protein and high-calorie foods can help you to gain weight, heal after an injury, and recover after an illness or surgery. The specific amount of daily protein and calories you need depends on: Your body weight. The reason this diet is recommended for you. Generally, a high-protein, high-calorie diet involves: Eating 250-500 extra calories each day. Making sure that you get enough of your daily calories from protein. Ask your health care provider how many of your calories should come from protein. Talk with a health care provider or a dietitian about how much protein and how many calories you need each day. Follow the diet as directed by your health care provider. What are tips for following this plan? Reading food labels Check the nutrition facts label for calories, grams of fat and protein. Items with more than 4 grams of protein are high-protein foods. Preparing meals Add whole milk, half-and-half, or heavy cream to cereal, pudding, soup, or hot cocoa. Add whole milk to instant breakfast drinks. Add peanut butter to oatmeal or smoothies. Add powdered milk to baked goods, smoothies, or milkshakes. Add powdered milk, cream, or butter to mashed potatoes. Add cheese to cooked vegetables. Make whole-milk yogurt parfaits. Top them with granola, fruit, or nuts. Add cottage cheese to fruit. Add avocado, cheese, or both to sandwiches or salads. Add avocado to smoothies. Add meat, poultry, or seafood to rice, pasta, casseroles, salads, and soups. Use mayonnaise when making egg salad, chicken salad, or tuna salad. Use peanut butter as a dip for fruits and vegetables or as a topping for pretzels, celery, or crackers. Add beans to casseroles, dips, and spreads. Add pureed beans to sauces and soups. Replace calorie-free drinks with calorie-containing drinks, such as milk and fruit juice. Replace water with milk or heavy cream when making foods such as  oatmeal, pudding, or cocoa. Add oil or butter to cooked vegetables and grains. Add cream cheese to sandwiches or as a topping on crackers and bread. Make cream-based pastas and soups. General information Ask your health care provider if you should take a nutritional supplement. Try to eat six small meals each day instead of three large meals. A general goal is to eat every 2 to 3 hours. Eat a balanced diet. In each meal, include one food that is high in protein and one food with fat in it. Keep nutritious snacks available, such as nuts, trail mixes, dried fruit, and yogurt. If you have kidney disease or diabetes, talk with your health care provider about how much protein is safe for you. Too much protein may put extra stress on your kidneys. Drink your calories. Choose high-calorie drinks and have them after your meals. Consider setting a timer to remind you to eat. You will want to eat even if you do not feel very hungry. What high-protein foods should I eat? Vegetables Soybeans. Peas. Grains Quinoa. Bulgur wheat. Buckwheat. Meats and other proteins Beef, pork, and poultry. Fish and seafood. Eggs. Tofu. Textured vegetable protein (TVP). Peanut butter. Nuts and seeds. Dried beans. Protein powders. Hummus. Dairy Whole milk. Whole-milk yogurt. Powdered milk. Cheese. Cottage Cheese. Eggnog. Beverages High-protein supplement drinks. Soy milk. Other foods Protein bars. The items listed above may not be a complete list of foods and beverages you can eat and drink. Contact a dietitian for more information. What high-calorie foods should I eat? Fruits Dried fruit. Fruit leather. Canned fruit in syrup. Fruit juice. Avocado. Vegetables Vegetables cooked in oil or butter. Fried potatoes. Grains Pasta. Quick   breads. Muffins. Pancakes. Ready-to-eat cereal. Meats and other proteins Peanut butter. Nuts and seeds. Dairy Heavy cream. Whipped cream. Cream cheese. Sour cream. Ice cream. Custard.  Pudding. Whole milk dairy products. Beverages Meal-replacement beverages. Nutrition shakes. Fruit juice. Seasonings and condiments Salad dressing. Mayonnaise. Alfredo sauce. Fruit preserves or jelly. Honey. Syrup. Sweets and desserts Cake. Cookies. Pie. Pastries. Candy bars. Chocolate. Fats and oils Butter or margarine. Oil. Gravy. Other foods Meal-replacement bars. The items listed above may not be a complete list of foods and beverages you can eat and drink. Contact a dietitian for more information. Summary A high-protein, high-calorie diet can help you gain weight or heal faster after an injury, illness, or surgery. To increase your protein and calories, add ingredients such as whole milk, peanut butter, cheese, beans, meat, or seafood to meal items. To get enough extra calories each day, include high-calorie foods and beverages at each meal. Adding a high-calorie drink or shake can be an easy way to help you get enough calories each day. Talk with your healthcare provider or dietitian about the best options for you. This information is not intended to replace advice given to you by your health care provider. Make sure you discuss any questions you have with your health care provider. Document Revised: 12/21/2019 Document Reviewed: 12/21/2019 Elsevier Patient Education  2022 [employer]  

## 2021-04-29 NOTE — Addendum Note (Signed)
Addended by: Brynda Peon F on: 04/29/2021 12:44 PM ? ? Modules accepted: Orders ? ?

## 2021-04-30 LAB — ANEMIA PROFILE B
Basophils Absolute: 0 10*3/uL (ref 0.0–0.2)
Basos: 0 %
EOS (ABSOLUTE): 0.1 10*3/uL (ref 0.0–0.4)
Eos: 3 %
Ferritin: 82 ng/mL (ref 15–150)
Folate: 15.6 ng/mL (ref >3.0)
Hematocrit: 39.1 % (ref 34.0–46.6)
Hemoglobin: 13.3 g/dL (ref 11.1–15.9)
Immature Grans (Abs): 0 10*3/uL (ref 0.0–0.1)
Immature Granulocytes: 0 %
Iron Saturation: 25 % (ref 15–55)
Iron: 64 ug/dL (ref 27–159)
Lymphocytes Absolute: 1.9 10*3/uL (ref 0.7–3.1)
Lymphs: 43 %
MCH: 29.3 pg (ref 26.6–33.0)
MCHC: 34 g/dL (ref 31.5–35.7)
MCV: 86 fL (ref 79–97)
Monocytes Absolute: 0.3 10*3/uL (ref 0.1–0.9)
Monocytes: 7 %
Neutrophils Absolute: 2.1 10*3/uL (ref 1.4–7.0)
Neutrophils: 47 %
Platelets: 284 10*3/uL (ref 150–450)
RBC: 4.54 x10E6/uL (ref 3.77–5.28)
RDW: 14.6 % (ref 11.7–15.4)
Retic Ct Pct: 1.4 % (ref 0.6–2.6)
Total Iron Binding Capacity: 251 ug/dL (ref 250–450)
UIBC: 187 ug/dL (ref 131–425)
Vitamin B-12: 930 pg/mL (ref 232–1245)
WBC: 4.4 10*3/uL (ref 3.4–10.8)

## 2021-04-30 LAB — CMP14+EGFR
ALT: 78 IU/L — ABNORMAL HIGH (ref 0–32)
AST: 79 IU/L — ABNORMAL HIGH (ref 0–40)
Albumin/Globulin Ratio: 1.9 (ref 1.2–2.2)
Albumin: 4.6 g/dL (ref 3.9–5.0)
Alkaline Phosphatase: 64 IU/L (ref 44–121)
BUN/Creatinine Ratio: 21 (ref 9–23)
BUN: 16 mg/dL (ref 6–20)
Bilirubin Total: 0.3 mg/dL (ref 0.0–1.2)
CO2: 21 mmol/L (ref 20–29)
Calcium: 9.6 mg/dL (ref 8.7–10.2)
Chloride: 103 mmol/L (ref 96–106)
Creatinine, Ser: 0.76 mg/dL (ref 0.57–1.00)
Globulin, Total: 2.4 g/dL (ref 1.5–4.5)
Glucose: 86 mg/dL (ref 70–99)
Potassium: 4.4 mmol/L (ref 3.5–5.2)
Sodium: 139 mmol/L (ref 134–144)
Total Protein: 7 g/dL (ref 6.0–8.5)
eGFR: 114 mL/min/{1.73_m2} (ref >59)

## 2021-04-30 LAB — LIPID PANEL
Chol/HDL Ratio: 2.8 ratio (ref 0.0–4.4)
Cholesterol, Total: 182 mg/dL (ref 100–199)
HDL: 66 mg/dL (ref >39)
LDL Chol Calc (NIH): 106 mg/dL — ABNORMAL HIGH (ref 0–99)
Triglycerides: 51 mg/dL (ref 0–149)
VLDL Cholesterol Cal: 10 mg/dL (ref 5–40)

## 2021-04-30 LAB — TSH: TSH: 0.999 u[IU]/mL (ref 0.450–4.500)

## 2021-05-02 ENCOUNTER — Other Ambulatory Visit: Payer: Self-pay | Admitting: Family Medicine

## 2021-05-02 DIAGNOSIS — R7989 Other specified abnormal findings of blood chemistry: Secondary | ICD-10-CM

## 2021-05-20 NOTE — Progress Notes (Signed)
?Office Note  ? ? ? ?CC:  follow up ?Requesting Provider:  [name], FNP ? ?HPI: Nicole Hodge is a 21 y.o. (2000-10-23) female who presents for follow up of RLE DVT. 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  She underwent mechanical thrombectomy of R common iliac, external iliac, and common femoral vein with Inari Clottriever by Dr. Chestine Spore in May of 2020. Stenting not performed due to young age and family planning. In follow up she has had some heaviness in her right leg and swelling. She has been compliant with her thigh high compression stocking as well as Xarelto.  ? ?Today she reports she is doing well. She has started back to working out and tolerating it well. She does still have some intermittent mild swelling. She has mostly been wearing her compression while she exercises otherwise her swelling has been so minimal she has not been using the compression. 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 She continues to take her Xarelto without issues.  ? ?Past Medical History:  ?Diagnosis Date  ? DVT (deep venous thrombosis) (HCC)   ? ? ?Past Surgical History:  ?Procedure Laterality Date  ? IVC VENOGRAPHY N/A 06/06/2018  ? Procedure: IVC Venography;  Surgeon: Maeola Harman, MD;  Location: [facility] INVASIVE CV LAB;  Service: Cardiovascular;  Laterality: N/A;  ? PERIPHERAL VASCULAR THROMBECTOMY N/A 06/06/2018  ? Procedure: PERIPHERAL VASCULAR THROMBECTOMY;  Surgeon: Maeola Harman, MD;  Location: [facility] INVASIVE CV LAB;  Service: Cardiovascular;  Laterality: N/A;  ? ? ?Social History  ? ?Socioeconomic History  ? Marital status: Single  ?  Spouse name: Not on file  ? Number of children: 0  ? Years of education: Not on file  ? Highest education level: Not on file  ?Occupational History  ? Not on file  ?Tobacco Use  ? Smoking status: Never  ? Smokeless tobacco: Never  ?Vaping Use  ? Vaping Use: Never used  ?Substance and Sexual Activity   ? Alcohol use: No  ? Drug use: Yes  ?  Types: Marijuana  ? Sexual activity: Yes  ?  Birth control/protection: None  ?Other Topics Concern  ? Not on file  ?Social History Narrative  ? Not on file  ? ?Social Determinants of Health  ? ?Financial Resource Strain: Not on file  ?Food Insecurity: Not on file  ?Transportation Needs: Not on file  ?Physical Activity: Not on file  ?Stress: Not on file  ?Social Connections: Not on file  ?Intimate Partner Violence: Not on file  ? ? ?Family History  ?Problem Relation Age of Onset  ? Healthy Mother   ? Healthy Father   ? Cerebral palsy Sister   ? Healthy Brother   ? ? ?Current Outpatient Medications  ?Medication Sig Dispense Refill  ? MACA ROOT PO Take by mouth daily.    ? rivaroxaban (XARELTO) 20 MG TABS tablet TAKE 1 TABLET BY MOUTH DAILY WITH SUPPER. 30 tablet 1  ? ?No current facility-administered medications for this visit.  ? ? ?No Known Allergies ? ? ?REVIEW OF SYSTEMS:  ?[X]  denotes positive finding, [ ]  denotes negative finding ?Cardiac  Comments:  ?Chest pain or chest pressure:    ?Shortness of breath upon exertion:    ?Short of breath when lying flat:    ?Irregular heart rhythm:    ?    ?Vascular    ?Pain in calf, thigh, or hip brought on  by ambulation:    ?Pain in feet at night that wakes you up from your sleep:     ?Blood clot in your veins:    ?Leg swelling:     ?    ?Pulmonary    ?Oxygen at home:    ?Productive cough:     ?Wheezing:     ?    ?Neurologic    ?Sudden weakness in arms or legs:     ?Sudden numbness in arms or legs:     ?Sudden onset of difficulty speaking or slurred speech:    ?Temporary loss of vision in one eye:     ?Problems with dizziness:     ?    ?Gastrointestinal    ?Blood in stool:     ?Vomited blood:     ?    ?Genitourinary    ?Burning when urinating:     ?Blood in urine:    ?    ?Psychiatric    ?Major depression:     ?    ?Hematologic    ?Bleeding problems:    ?Problems with blood clotting too easily:    ?    ?Skin    ?Rashes or ulcers:    ?     ?Constitutional    ?Fever or chills:    ? ? ?PHYSICAL EXAMINATION: ? ?Vitals:  ? 05/24/21 0918  ?BP: (!) 99/55  ?Pulse: 74  ?Resp: 18  ?Temp: 98.1 ?F (36.7 ?C)  ?TempSrc: Temporal  ?SpO2: 100%  ?Weight: 105 lb 8 oz (47.9 kg)  ?Height: 5\' 2"  (1.575 m)  ? ? ?General:  WDWN in NAD; vital signs documented above ?Gait: Normal ?HENT: WNL, normocephalic ?Pulmonary: normal non-labored breathing , without wheezing ?Cardiac: regular HR, without  Murmurs  ?Abdomen: flat soft ?Vascular Exam/Pulses: ? Right Left  ?Radial 2+ (normal) 2+ (normal)  ?Femoral 2+ (normal) 2+ (normal)  ?Popliteal 2+ (normal) 2+ (normal)  ?DP 2+ (normal) 2+ (normal)  ?PT 2+ (normal) 2+ (normal)  ? ?Extremities: without ischemic changes, without Gangrene , without cellulitis; without open wounds;  ?Musculoskeletal: no muscle wasting or atrophy  ?Neurologic: A&O X 3;  No focal weakness or paresthesias are detected ?Psychiatric:  The pt has Normal affect. ? ? ?Non-Invasive Vascular Imaging:  05/24/21 ?VAS 05/26/21 lower extremity venous (DVT): ?Summary:  ?RIGHT:  ?- Findings consistent with chronic deep vein thrombosis involving the right popliteal vein.  ?- There is no evidence of superficial venous thrombosis.  ?   ?VAS [place] Iliac/IVC: ?Summary:  ?IVC/Iliac: No evidence of thrombus in IVC and Iliac veins.  ?There is no evidence of thrombus involving the right common iliac vein.  ?There is no evidence of thrombus involving the right external iliac vein.  ? ? ?ASSESSMENT/PLAN:: 21 y.o. female here for follow up for RLE DVT. She underwent mechanical thrombectomy of R common iliac, external iliac, and common femoral vein with Inari Clottriever by Dr. 36 in May of 2020. Stenting not performed due to young age and family planning. She has been doing well since her intervention. She has very minimal swelling and no associated pain. 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 This is unchanged from prior studies. No new DVT or SVT. Her IVC and Iliac veins  remain patent without DVT.  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 ? ?Graceann Congressorrina Jen Benedict, PA-C ?[facility] ?[phone] ? ?Clinic MD:   Steve RattlerHaw[facility]/ [facility] ?

## 2021-05-24 ENCOUNTER — Ambulatory Visit (INDEPENDENT_AMBULATORY_CARE_PROVIDER_SITE_OTHER): Payer: Medicaid Other | Admitting: Physician Assistant

## 2021-05-24 ENCOUNTER — Other Ambulatory Visit: Payer: Self-pay

## 2021-05-24 ENCOUNTER — Ambulatory Visit (HOSPITAL_COMMUNITY)
Admission: RE | Admit: 2021-05-24 | Discharge: 2021-05-24 | Disposition: A | Discharge status: Home / Self Care | Payer: Medicaid Other | Source: Ambulatory Visit | Attending: Vascular Surgery | Admitting: Vascular Surgery

## 2021-05-24 ENCOUNTER — Ambulatory Visit (INDEPENDENT_AMBULATORY_CARE_PROVIDER_SITE_OTHER)
Admission: RE | Admit: 2021-05-24 | Discharge: 2021-05-24 | Disposition: A | Discharge status: Home / Self Care | Payer: Medicaid Other | Source: Ambulatory Visit | Attending: Vascular Surgery | Admitting: Vascular Surgery

## 2021-05-24 VITALS — BP 99/55 | HR 74 | Temp 98.1°F | Resp 18 | Ht 62.0 in | Wt 105.5 lb

## 2021-05-24 DIAGNOSIS — I82421 Acute embolism and thrombosis of right iliac vein: Secondary | ICD-10-CM | POA: Diagnosis not present

## 2021-05-24 DIAGNOSIS — I82521 Chronic embolism and thrombosis of right iliac vein: Secondary | ICD-10-CM

## 2021-07-27 ENCOUNTER — Telehealth: Payer: Self-pay | Admitting: Family

## 2021-07-28 ENCOUNTER — Telehealth: Payer: Self-pay | Admitting: *Deleted

## 2021-07-28 NOTE — Telephone Encounter (Signed)
Patient called requesting prescription for compression stockings to be sent to [facility].  A prescription was faxed ton [facility] for 20-30 mmhg thigh high stockings.  Faxed to [phone] attention [facility]enport.

## 2021-09-29 DIAGNOSIS — I82493 Acute embolism and thrombosis of other specified deep vein of lower extremity, bilateral: Secondary | ICD-10-CM | POA: Diagnosis not present

## 2021-10-05 ENCOUNTER — Emergency Department (HOSPITAL_COMMUNITY): Payer: Self-pay

## 2021-10-05 ENCOUNTER — Other Ambulatory Visit: Payer: Self-pay

## 2021-10-05 ENCOUNTER — Encounter (HOSPITAL_COMMUNITY): Payer: Self-pay | Admitting: Emergency Medicine

## 2021-10-05 ENCOUNTER — Encounter (HOSPITAL_COMMUNITY): Payer: Self-pay | Admitting: Hematology

## 2021-10-05 ENCOUNTER — Emergency Department (HOSPITAL_COMMUNITY)
Admission: EM | Admit: 2021-10-05 | Discharge: 2021-10-05 | Disposition: A | Discharge status: Home / Self Care | Payer: No Typology Code available for payment source | Attending: Emergency Medicine | Admitting: Emergency Medicine

## 2021-10-05 DIAGNOSIS — R55 Syncope and collapse: Secondary | ICD-10-CM | POA: Insufficient documentation

## 2021-10-05 DIAGNOSIS — Z7901 Long term (current) use of anticoagulants: Secondary | ICD-10-CM | POA: Insufficient documentation

## 2021-10-05 DIAGNOSIS — Z23 Encounter for immunization: Secondary | ICD-10-CM | POA: Insufficient documentation

## 2021-10-05 DIAGNOSIS — Y990 Civilian activity done for income or pay: Secondary | ICD-10-CM | POA: Diagnosis not present

## 2021-10-05 DIAGNOSIS — W2203XA Walked into furniture, initial encounter: Secondary | ICD-10-CM | POA: Diagnosis not present

## 2021-10-05 DIAGNOSIS — S0181XA Laceration without foreign body of other part of head, initial encounter: Secondary | ICD-10-CM | POA: Insufficient documentation

## 2021-10-05 DIAGNOSIS — S0993XA Unspecified injury of face, initial encounter: Secondary | ICD-10-CM | POA: Diagnosis present

## 2021-10-05 LAB — CBC WITH DIFFERENTIAL/PLATELET
Abs Immature Granulocytes: 0.04 10*3/uL (ref 0.00–0.07)
Basophils Absolute: 0 10*3/uL (ref 0.0–0.1)
Basophils Relative: 0 %
Eosinophils Absolute: 0.1 10*3/uL (ref 0.0–0.5)
Eosinophils Relative: 1 %
HCT: 41.7 % (ref 36.0–46.0)
Hemoglobin: 13.7 g/dL (ref 12.0–15.0)
Immature Granulocytes: 1 %
Lymphocytes Relative: 23 %
Lymphs Abs: 2 10*3/uL (ref 0.7–4.0)
MCH: 29 pg (ref 26.0–34.0)
MCHC: 32.9 g/dL (ref 30.0–36.0)
MCV: 88.2 fL (ref 80.0–100.0)
Monocytes Absolute: 0.5 10*3/uL (ref 0.1–1.0)
Monocytes Relative: 6 %
Neutro Abs: 6.1 10*3/uL (ref 1.7–7.7)
Neutrophils Relative %: 69 %
Platelets: 230 10*3/uL (ref 150–400)
RBC: 4.73 MIL/uL (ref 3.87–5.11)
RDW: 16.5 % — ABNORMAL HIGH (ref 11.5–15.5)
WBC: 8.8 10*3/uL (ref 4.0–10.5)
nRBC: 0 % (ref 0.0–0.2)

## 2021-10-05 LAB — BASIC METABOLIC PANEL
Anion gap: 8 (ref 5–15)
BUN: 21 mg/dL — ABNORMAL HIGH (ref 6–20)
CO2: 22 mmol/L (ref 22–32)
Calcium: 8.9 mg/dL (ref 8.9–10.3)
Chloride: 108 mmol/L (ref 98–111)
Creatinine, Ser: 1.02 mg/dL — ABNORMAL HIGH (ref 0.44–1.00)
GFR, Estimated: >60 mL/min (ref >60)
Glucose, Bld: 95 mg/dL (ref 70–99)
Potassium: 3.8 mmol/L (ref 3.5–5.1)
Sodium: 138 mmol/L (ref 135–145)

## 2021-10-05 LAB — URINALYSIS, ROUTINE W REFLEX MICROSCOPIC
Bilirubin Urine: NEGATIVE
Glucose, UA: NEGATIVE mg/dL
Hgb urine dipstick: NEGATIVE
Ketones, ur: NEGATIVE mg/dL
Leukocytes,Ua: NEGATIVE
Nitrite: NEGATIVE
Protein, ur: NEGATIVE mg/dL
Specific Gravity, Urine: 1.013 (ref 1.005–1.030)
pH: 7 (ref 5.0–8.0)

## 2021-10-05 LAB — I-STAT BETA HCG BLOOD, ED (NOT ORDERABLE): I-stat hCG, quantitative: <5 m[IU]/mL (ref <5)

## 2021-10-05 LAB — TROPONIN I (HIGH SENSITIVITY)
Troponin I (High Sensitivity): 3 ng/L (ref <18)
Troponin I (High Sensitivity): 3 ng/L (ref <18)

## 2021-10-05 LAB — D-DIMER, QUANTITATIVE: D-Dimer, Quant: 0.29 ug/mL-FEU (ref 0.00–0.50)

## 2021-10-05 MED ORDER — LIDOCAINE-EPINEPHRINE-TETRACAINE (LET) TOPICAL GEL
3.0000 mL | Freq: Once | TOPICAL | Status: AC
  Administered 2021-10-05: 3 mL via TOPICAL
  Filled 2021-10-05: qty 3

## 2021-10-05 MED ORDER — TETANUS-DIPHTH-ACELL PERTUSSIS 5-2.5-18.5 LF-MCG/0.5 IM SUSY
0.5000 mL | PREFILLED_SYRINGE | Freq: Once | INTRAMUSCULAR | Status: AC
  Administered 2021-10-05: 0.5 mL via INTRAMUSCULAR
  Filled 2021-10-05: qty 0.5

## 2021-10-05 NOTE — Discharge Instructions (Addendum)
You were seen in the emergency room and for evaluation after a syncopal episode.  Your imaging was unremarkable.  Your labs are unremarkable.  I likely think this was a vasovagal response.  I was able to suture up the laceration on your chin.  You will need to return to your primary care doctor, urgent care, or the emergency department in 7 days for suture removal.  Please make sure you are cleaning the wound with Dial soap and water.  If you have any chest pain, shortness of breath, palpitations, confusion, headache, neck pain, fever, purulent discharge from the wound, please return to the nearest emergency department for reevaluation.  Contact a doctor if: You have episodes of near fainting. Get help right away if: You pass out or faint. You hit your head or are injured after fainting. You have any of these symptoms: Fast or uneven heartbeats (palpitations). Pain in your chest, belly, or back. Shortness of breath. You have jerky movements that you cannot control (seizure). You have a very bad headache. You are confused. You have problems with how you see (vision). You are very weak. You have trouble walking. You are bleeding from your mouth or your butt (rectum). You have black or tarry poop (stool). These symptoms may be an emergency. Get help right away. Call your local emergency services (911 in the [place] Do not wait to see if the symptoms will go away. Do not drive yourself to the hospital.

## 2021-10-05 NOTE — ED Notes (Signed)
Clarified with Achille Rich PA that IV can be started for Lab draws.

## 2021-10-05 NOTE — ED Provider Notes (Signed)
[facility] EMERGENCY DEPARTMENT Provider Note   CSN: [id number] Arrival date & time: 10/05/21  0126     History Chief Complaint  Patient presents with   Syncope / Chin Laceration     Nicole Hodge is a 21 y.o. 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 Earlier today, the patient was at work at [employer] on the [employer]. She reports that she was wearing a jacket and was really warm so she took it off. She reports that she was still hot and tried to walk to get water when there wasn't any, so she walked to get her co-worker to help her. As walking to the breakroom, she felt more lightheaded and had a syncopal episode hitting her chin on the metal table. She reports that she doesn't think she was out for long. She reports not the best compliance to her [employer]. She denies any chest pain, SOB, abdominal pain, nausea, vomiting, palpitations, leg swelling, neck pain, back pain, headache, blurry vision.   HPI     Home Medications Prior to Admission medications   Medication Sig Start Date End Date Taking? Authorizing Provider  MACA ROOT PO Take by mouth daily.    [provider]  rivaroxaban (XARELTO) 20 MG TABS tablet TAKE 1 TABLET BY MOUTH DAILY WITH SUPPER. 04/18/21   Marty Heck, MD      Allergies    Patient has no known allergies.    Review of Systems   Review of Systems  Constitutional:  Negative for chills and fever.  Eyes:  Negative for photophobia and visual disturbance.  Respiratory:  Negative for cough and shortness of breath.   Cardiovascular:  Negative for chest pain, palpitations and leg swelling.  Gastrointestinal:  Negative for abdominal pain, constipation, diarrhea, nausea and vomiting.  Genitourinary:  Negative for dysuria and hematuria.  Musculoskeletal:  Negative for arthralgias and back pain.  Skin:  Positive for wound.  Neurological:  Positive for syncope.  Negative for weakness, light-headedness and headaches.    Physical Exam Updated Vital Signs BP 107/82   Pulse 61   Temp 98 F (36.7 C) (Oral)   Resp 20   Ht 5\' 1"  (1.549 m)   Wt 49.4 kg   LMP 09/16/2021   SpO2 100%   BMI 20.60 kg/m  Physical Exam Vitals and nursing note reviewed.  Constitutional:      General: She is not in acute distress.    Appearance: Normal appearance. She is not ill-appearing or toxic-appearing.  HENT:     Head: Normocephalic.     Comments: 1.5cm noted to the chin.    Nose: Nose normal.     Mouth/Throat:     Mouth: Mucous membranes are moist.     Comments: No pain on opening and closing the jaw. Some tenderness to the lower jaw. Dentition intact.  Eyes:     General: No scleral icterus. Neck:     Comments: Full ROM without pain. No step off or deformities.  Cardiovascular:     Rate and Rhythm: Normal rate and regular rhythm.  Pulmonary:     Effort: Pulmonary effort is normal.     Breath sounds: Normal breath sounds.  Chest:     Chest wall: No tenderness.  Abdominal:     General: Abdomen is flat. Bowel sounds are normal.     Palpations: Abdomen is soft.     Tenderness: There is no  abdominal tenderness. There is no guarding or rebound.  Musculoskeletal:        General: No deformity.     Cervical back: Normal range of motion. No tenderness.     Right lower leg: No edema.     Left lower leg: No edema.     Comments: No tenderness to the midline or paraspinal cervical, thoracic, or lumbar spine. No step off or deformities.   Skin:    General: Skin is warm and dry.  Neurological:     General: No focal deficit present.     Mental Status: She is alert. Mental status is at baseline.     Cranial Nerves: No cranial nerve deficit.     Sensory: No sensory deficit.     Motor: No weakness.     Gait: Gait normal.     ED Results / Procedures / Treatments   Labs (all labs ordered are listed, but only abnormal results are displayed) Labs Reviewed   BASIC METABOLIC PANEL - Abnormal; Notable for the following components:      Result Value   BUN 21 (*)    Creatinine, Ser 1.02 (*)    All other components within normal limits  CBC WITH DIFFERENTIAL/PLATELET - Abnormal; Notable for the following components:   RDW 16.5 (*)    All other components within normal limits  D-DIMER, QUANTITATIVE  URINALYSIS, ROUTINE W REFLEX MICROSCOPIC  I-STAT BETA HCG BLOOD, ED ([facility], [facility], [facility] ONLY)  I-STAT BETA HCG BLOOD, ED (NOT ORDERABLE)  TROPONIN I (HIGH SENSITIVITY)  TROPONIN I (HIGH SENSITIVITY)    EKG EKG Interpretation  Date/Time:  Wednesday October 05 2021 01:55:55 EDT Ventricular Rate:  77 PR Interval:  170 QRS Duration: 90 QT Interval:  372 QTC Calculation: 420 R Axis:   76 Text Interpretation: Normal sinus rhythm Normal ECG When compared with ECG of 30-May-2018 18:02, PREVIOUS ECG IS PRESENT given artifact/baseline wander, no obvious changes from april 2020 Confirmed by Merrily Pew [phone]) on 10/05/2021 4:06:46 AM  Radiology CT Head Wo Contrast  Result Date: 10/05/2021 CLINICAL DATA:  Head trauma, moderate-severe syncope; Neck trauma, dangerous injury mechanism (Age 68-64y) hyperextension with syncope; Facial trauma, blunt trauma to lower mandiable after syncopal episode EXAM: CT HEAD WITHOUT CONTRAST CT MAXILLOFACIAL WITHOUT CONTRAST CT CERVICAL SPINE WITHOUT CONTRAST TECHNIQUE: Multidetector CT imaging of the head, cervical spine, and maxillofacial structures were performed using the standard protocol without intravenous contrast. Multiplanar CT image reconstructions of the cervical spine and maxillofacial structures were also generated. RADIATION DOSE REDUCTION: This exam was performed according to the departmental dose-optimization program which includes automated exposure control, adjustment of the mA and/or kV according to patient size and/or use of iterative reconstruction technique. COMPARISON:  None Available. FINDINGS: CT HEAD FINDINGS  Brain: No evidence of acute infarction, hemorrhage, hydrocephalus, extra-axial collection or mass lesion/mass effect. Vascular: No hyperdense vessel or unexpected calcification. Skull: No acute fracture. Other: No mastoid effusions. CT MAXILLOFACIAL FINDINGS Osseous: No fracture or mandibular dislocation. No destructive process. Orbits: Negative. No traumatic or inflammatory finding. Sinuses: Clear. Soft tissues: Negative. CT CERVICAL SPINE FINDINGS Alignment: Straightening.  No substantial sagittal subluxation. Skull base and vertebrae: No evidence of acute fracture. Vertebral body heights are maintained. Soft tissues and spinal canal: No prevertebral fluid or swelling. No visible canal hematoma. Disc levels:  No significant focal bony degenerative change. Upper chest: Clear lung apices. IMPRESSION: 1. No evidence of acute intracranial abnormality or facial fracture. 2. No evidence of acute fracture or traumatic malalignment. Electronically Signed  By: Feliberto Harts M.D.   On: 10/05/2021 09:20   CT Maxillofacial Wo Contrast  Result Date: 10/05/2021 CLINICAL DATA:  Head trauma, moderate-severe syncope; Neck trauma, dangerous injury mechanism (Age 94-64y) hyperextension with syncope; Facial trauma, blunt trauma to lower mandiable after syncopal episode EXAM: CT HEAD WITHOUT CONTRAST CT MAXILLOFACIAL WITHOUT CONTRAST CT CERVICAL SPINE WITHOUT CONTRAST TECHNIQUE: Multidetector CT imaging of the head, cervical spine, and maxillofacial structures were performed using the standard protocol without intravenous contrast. Multiplanar CT image reconstructions of the cervical spine and maxillofacial structures were also generated. RADIATION DOSE REDUCTION: This exam was performed according to the departmental dose-optimization program which includes automated exposure control, adjustment of the mA and/or kV according to patient size and/or use of iterative reconstruction technique. COMPARISON:  None Available.  FINDINGS: CT HEAD FINDINGS Brain: No evidence of acute infarction, hemorrhage, hydrocephalus, extra-axial collection or mass lesion/mass effect. Vascular: No hyperdense vessel or unexpected calcification. Skull: No acute fracture. Other: No mastoid effusions. CT MAXILLOFACIAL FINDINGS Osseous: No fracture or mandibular dislocation. No destructive process. Orbits: Negative. No traumatic or inflammatory finding. Sinuses: Clear. Soft tissues: Negative. CT CERVICAL SPINE FINDINGS Alignment: Straightening.  No substantial sagittal subluxation. Skull base and vertebrae: No evidence of acute fracture. Vertebral body heights are maintained. Soft tissues and spinal canal: No prevertebral fluid or swelling. No visible canal hematoma. Disc levels:  No significant focal bony degenerative change. Upper chest: Clear lung apices. IMPRESSION: 1. No evidence of acute intracranial abnormality or facial fracture. 2. No evidence of acute fracture or traumatic malalignment. Electronically Signed   By: Feliberto Harts M.D.   On: 10/05/2021 09:20   CT Cervical Spine Wo Contrast  Result Date: 10/05/2021 CLINICAL DATA:  Head trauma, moderate-severe syncope; Neck trauma, dangerous injury mechanism (Age 94-64y) hyperextension with syncope; Facial trauma, blunt trauma to lower mandiable after syncopal episode EXAM: CT HEAD WITHOUT CONTRAST CT MAXILLOFACIAL WITHOUT CONTRAST CT CERVICAL SPINE WITHOUT CONTRAST TECHNIQUE: Multidetector CT imaging of the head, cervical spine, and maxillofacial structures were performed using the standard protocol without intravenous contrast. Multiplanar CT image reconstructions of the cervical spine and maxillofacial structures were also generated. RADIATION DOSE REDUCTION: This exam was performed according to the departmental dose-optimization program which includes automated exposure control, adjustment of the mA and/or kV according to patient size and/or use of iterative reconstruction technique.  COMPARISON:  None Available. FINDINGS: CT HEAD FINDINGS Brain: No evidence of acute infarction, hemorrhage, hydrocephalus, extra-axial collection or mass lesion/mass effect. Vascular: No hyperdense vessel or unexpected calcification. Skull: No acute fracture. Other: No mastoid effusions. CT MAXILLOFACIAL FINDINGS Osseous: No fracture or mandibular dislocation. No destructive process. Orbits: Negative. No traumatic or inflammatory finding. Sinuses: Clear. Soft tissues: Negative. CT CERVICAL SPINE FINDINGS Alignment: Straightening.  No substantial sagittal subluxation. Skull base and vertebrae: No evidence of acute fracture. Vertebral body heights are maintained. Soft tissues and spinal canal: No prevertebral fluid or swelling. No visible canal hematoma. Disc levels:  No significant focal bony degenerative change. Upper chest: Clear lung apices. IMPRESSION: 1. No evidence of acute intracranial abnormality or facial fracture. 2. No evidence of acute fracture or traumatic malalignment. Electronically Signed   By: Feliberto Harts M.D.   On: 10/05/2021 09:20   DG Orthopantogram  Result Date: 10/05/2021 CLINICAL DATA:  Larey Seat and hit chin on table. EXAM: ORTHOPANTOGRAM/PANORAMIC COMPARISON:  None Available. FINDINGS: No fracture or mandibular dislocation is seen. The teeth are grossly intact. Metal piercings are noted superimposing in the area of the nose and lateral right face/ear.  IMPRESSION: No single-view radiographic evidence of fractures or mandibular dislocation. Obtain CT if there is clinical concern for occult acute injury. Electronically Signed   By: Telford Nab M.D.   On: 10/05/2021 02:45    Procedures .Marland KitchenLaceration Repair  Date/Time: 10/05/2021 12:57 PM  Performed by: Sherrell Puller, PA-C Authorized by: Sherrell Puller, PA-C   Consent:    Consent obtained:  Verbal   Consent given by:  Patient   Risks, benefits, and alternatives were discussed: yes     Risks discussed:  Infection, need for  additional repair, pain, poor cosmetic result and poor wound healing   Alternatives discussed:  No treatment Universal protocol:    Test results available: yes     Imaging studies available: yes     Patient identity confirmed:  Verbally with patient Anesthesia:    Anesthesia method:  Topical application   Topical anesthetic:  LET Laceration details:    Location:  Face   Face location:  Chin   Length (cm):  1.5 Pre-procedure details:    Preparation:  Patient was prepped and draped in usual sterile fashion and imaging obtained to evaluate for foreign bodies Treatment:    Area cleansed with:  Povidone-iodine   Amount of cleaning:  Standard Skin repair:    Repair method:  Sutures   Suture size:  5-0   Suture material:  Prolene   Suture technique:  Simple interrupted   Number of sutures:  4 Approximation:    Approximation:  Close Repair type:    Repair type:  Simple Post-procedure details:    Dressing:  Open (no dressing)   Procedure completion:  Tolerated well, no immediate complications   Medications Ordered in ED Medications  lidocaine-EPINEPHrine-tetracaine (LET) topical gel (3 mLs Topical Given 10/05/21 1003)  Tdap (BOOSTRIX) injection 0.5 mL (0.5 mLs Intramuscular Given 10/05/21 1332)    ED Course/ Medical Decision Making/ A&P                           Medical Decision Making Amount and/or Complexity of Data Reviewed Labs: ordered. Radiology: ordered.  Risk Prescription drug management.    21 y/o F presents to the ED for evaluation after a syncopal episode resulting in a chin laceration. Differential diagnosis includes but is not limited to cardiogenic syncope, vasovagal syncope, PE, intercranial bleed, neck fracture, jaw fracture. Vital signs are within normal limited. Physical exam as noted above.   Will order labs and imaging. The patietn denies any chest pain, SOB, palpitations, or leg swelling, but given her h/o blood clots and non compliance to medications,  will order d-dimer.   I independently reviewed and interpreted the patient's labs. Urinalysis unremarkable. Pregnancy negative. D-dimer negative. Troponin 3 with repeat 3. CBC without leukocytosis or anemia. BMP shows Creatinine at 1.02, BUN slightly elevated at 21, slight increase from baseline around 0.80.  CT maxillofacial shows 1. No evidence of acute intracranial abnormality or facial fracture. 2. No evidence of acute fracture or traumatic malalignment.   CT head and neck  1. No evidence of acute intracranial abnormality or facial fracture. 2. No evidence of acute fracture or traumatic malalignment.   Please see procedure note for repair of the chin. Patient tolerated the procedure well.   Given the patient's benign labs and imaging, this was likely a vasovagal response. Will have her follow up with her PCP. Discussed with my attending who reports that she is safe for discharge.   I doubt any ACS  given negative troponins and reassuring EKG. Doubt any PE as the patient doesn't have any chest pain, leg swelling, SOB, or palpitations. The patient reports that she is feeling better and would like to go home.   I discussed with her her lab and imaging results. We discussed returning to the ER, UC, or PCP office in 7 days for suture removal. We discussed staying well hydrated with plenty of fluids, mainly water. We discussed strict return precautions and red flag symptoms. The patient verbalized her understanding and agrees to the plan. The patient is stable and being discharged home in good condition.   I discussed this case with my attending physician who cosigned this note including patient's presenting symptoms, physical exam, and planned diagnostics and interventions. Attending physician stated agreement with plan or made changes to plan which were implemented.   Final Clinical Impression(s) / ED Diagnoses Final diagnoses:  Syncope and collapse  Chin laceration, initial encounter    Rx /  DC Orders ED Discharge Orders     None         Achille Rich, PA-C 10/07/21 2327    Terald Sleeper, MD 10/08/21 1101

## 2021-10-05 NOTE — ED Provider Triage Note (Signed)
Emergency Medicine Provider Triage Evaluation Note  [name] , a 21 y.o. female  was evaluated in triage.  Pt complains of syncopal episode, happened while she is at work, states that while she is reading she for very hot, went to go splash water on her face but the time she was walking back she was told that she fainted, states she was out for approximately a minute, states she came back was fine, she is she did land of her chin, she has having some pain on her chin, no headaches no change in vision no numbness or tingling arms or legs, she denies having chest pain or shortness with prior to the incident, no history of seizure disorders, denies urinary incontinency..  Review of Systems  Positive: Syncope, chin injury Negative: Headaches, change in vision  Physical Exam  BP (!) 115/59   Pulse 75   Temp 97.8 F (36.6 C) (Oral)   Resp 16   LMP 09/16/2021   SpO2 100%  Gen:   Awake, no distress   Resp:  Normal effort  MSK:   Moves extremities without difficulty  Other:  No facial asymmetry no difficulty with word finding following two-step commands no unilateral weakness present.  Gait fully intact.  Medical Decision Making  Medically screening exam initiated at 2:07 AM.  Appropriate orders placed.  Reeva A Urbanek was informed that the remainder of the evaluation will be completed by another provider, this initial triage assessment does not replace that evaluation, and the importance of remaining in the ED until their evaluation is complete.  Lab work imaging been ordered will need further work-up.   Carroll Sage, PA-C 10/05/21 [phone]

## 2021-10-05 NOTE — ED Triage Notes (Signed)
Patient reports brief syncopal episode at work this evening hit her chin against the table , presents with approx. 1/3" skin laceration at lower chin with minimal bleeding . Alert and oriented /respirations unlabored .

## 2021-10-05 NOTE — ED Notes (Signed)
Patient transported to CT 

## 2021-10-15 ENCOUNTER — Other Ambulatory Visit: Payer: Self-pay

## 2021-10-15 ENCOUNTER — Encounter (HOSPITAL_COMMUNITY): Payer: Self-pay | Admitting: Emergency Medicine

## 2021-10-15 ENCOUNTER — Emergency Department (HOSPITAL_COMMUNITY)
Admission: EM | Admit: 2021-10-15 | Discharge: 2021-10-15 | Discharge status: Against Medical Advice | Payer: Self-pay | Attending: Emergency Medicine | Admitting: Emergency Medicine

## 2021-10-15 ENCOUNTER — Ambulatory Visit (HOSPITAL_COMMUNITY)
Admission: EM | Admit: 2021-10-15 | Discharge: 2021-10-15 | Disposition: A | Discharge status: Home / Self Care | Payer: No Typology Code available for payment source | Attending: Internal Medicine | Admitting: Internal Medicine

## 2021-10-15 DIAGNOSIS — Z4800 Encounter for change or removal of nonsurgical wound dressing: Secondary | ICD-10-CM | POA: Insufficient documentation

## 2021-10-15 DIAGNOSIS — Z5321 Procedure and treatment not carried out due to patient leaving prior to being seen by health care provider: Secondary | ICD-10-CM | POA: Insufficient documentation

## 2021-10-15 NOTE — ED Notes (Signed)
3 sutures removed from chin

## 2021-10-15 NOTE — ED Triage Notes (Signed)
Pt reports here for suture removal on chin.

## 2021-10-15 NOTE — ED Notes (Signed)
Pt reports one suture fell out .

## 2021-10-15 NOTE — ED Triage Notes (Signed)
Patient here requesting suture removal, sutures placed last week. Denies pain, denies drainage, denies having any  complaints.

## 2021-10-27 ENCOUNTER — Encounter (HOSPITAL_COMMUNITY): Payer: Self-pay | Admitting: Hematology

## 2021-12-20 ENCOUNTER — Other Ambulatory Visit: Payer: Self-pay

## 2021-12-20 MED ORDER — RIVAROXABAN 20 MG PO TABS: ORAL_TABLET | ORAL | 6 refills | Status: DC

## 2021-12-20 NOTE — Telephone Encounter (Signed)
Pt called requesting refill for Xarelto.  Reviewed pt's chart, returned call for clarification, two identifiers used. Pt has not taken it since she ran out around the end of May 2023. 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 No issues to note. Spoke with Marisue Humble, [place] who advised to refill. Refill sent in for the next 6 months which should be around the time of her next appt. Called pt to inform her the refill was sent. Confirmed understanding.

## 2022-05-12 ENCOUNTER — Encounter (HOSPITAL_COMMUNITY): Payer: Self-pay | Admitting: Hematology

## 2022-05-23 ENCOUNTER — Other Ambulatory Visit: Payer: Self-pay | Admitting: *Deleted

## 2022-05-23 DIAGNOSIS — I82421 Acute embolism and thrombosis of right iliac vein: Secondary | ICD-10-CM

## 2022-06-01 ENCOUNTER — Ambulatory Visit (INDEPENDENT_AMBULATORY_CARE_PROVIDER_SITE_OTHER)
Admission: RE | Admit: 2022-06-01 | Discharge: 2022-06-01 | Disposition: A | Discharge status: Home / Self Care | Payer: Medicaid Other | Source: Ambulatory Visit | Attending: Vascular Surgery | Admitting: Vascular Surgery

## 2022-06-01 ENCOUNTER — Ambulatory Visit (INDEPENDENT_AMBULATORY_CARE_PROVIDER_SITE_OTHER): Payer: Medicaid Other | Admitting: Physician Assistant

## 2022-06-01 ENCOUNTER — Ambulatory Visit (HOSPITAL_COMMUNITY)
Admission: RE | Admit: 2022-06-01 | Discharge: 2022-06-01 | Disposition: A | Discharge status: Home / Self Care | Payer: Medicaid Other | Source: Ambulatory Visit | Attending: Vascular Surgery | Admitting: Vascular Surgery

## 2022-06-01 VITALS — BP 116/74 | HR 67 | Temp 98.2°F | Resp 20 | Ht 61.0 in | Wt 117.0 lb

## 2022-06-01 DIAGNOSIS — I82521 Chronic embolism and thrombosis of right iliac vein: Secondary | ICD-10-CM | POA: Diagnosis not present

## 2022-06-01 DIAGNOSIS — I82421 Acute embolism and thrombosis of right iliac vein: Secondary | ICD-10-CM | POA: Diagnosis not present

## 2022-06-01 MED ORDER — RIVAROXABAN 20 MG PO TABS: 20.0000 mg | ORAL_TABLET | Freq: Every day | ORAL | 11 refills | Status: DC

## 2022-06-01 MED ORDER — RIVAROXABAN (XARELTO) VTE STARTER PACK (15 & 20 MG): ORAL_TABLET | ORAL | 0 refills | Status: DC

## 2022-06-01 NOTE — Progress Notes (Signed)
Office Note  History of Present Illness   Nicole Hodge is a 22 y.o. (2000/03/27) female who presents for follow-up of right lower extremity DVT.  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  She underwent mechanical thrombectomy of the right common iliac, external iliac, and common femoral vein by Dr. Chestine Spore in May 2020.  Stenting was not performed due to young age and family-planning.  She returns today for follow-up.  She states that she has not been able to take her Xarelto daily since sometime in December.  She completely ran out of her Xarelto a few months ago.  For a brief period of time she did not have Medicaid coverage and could not get any more refills.  She now has Medicaid again.  She endorses similar symptoms since her last visit with [place].  She has intermittent mild swelling of the right lower extremity, which is well-controlled with compression stockings and exercise.  She denies any pain in the right lower extremity or ulcerations.  Past Medical History:  Diagnosis Date   DVT (deep venous thrombosis) (HCC)     Past Surgical History:  Procedure Laterality Date   IVC VENOGRAPHY N/A 06/06/2018   Procedure: IVC Venography;  Surgeon: Maeola Harman, MD;  Location: [facility] INVASIVE CV LAB;  Service: Cardiovascular;  Laterality: N/A;   PERIPHERAL VASCULAR THROMBECTOMY N/A 06/06/2018   Procedure: PERIPHERAL VASCULAR THROMBECTOMY;  Surgeon: Maeola Harman, MD;  Location: [facility] INVASIVE CV LAB;  Service: Cardiovascular;  Laterality: N/A;    Social History   Socioeconomic History   Marital status: Single    Spouse name: Not on file   Number of children: 0   Years of education: Not on file   Highest education level: Not on file  Occupational History   Not on file  Tobacco Use   Smoking status: Never   Smokeless tobacco: Never  Vaping Use   Vaping Use: Never used  Substance and Sexual Activity    Alcohol use: No   Drug use: Yes    Types: Marijuana   Sexual activity: Yes    Birth control/protection: None  Other Topics Concern   Not on file  Social History Narrative   Not on file   Social Determinants of Health   Financial Resource Strain: Not on file  Food Insecurity: Not on file  Transportation Needs: Not on file  Physical Activity: Not on file  Stress: Not on file  Social Connections: Not on file  Intimate Partner Violence: Not on file    Family History  Problem Relation Age of Onset   Healthy Mother    Healthy Father    Cerebral palsy Sister    Healthy Brother     Current Outpatient Medications  Medication Sig Dispense Refill   MACA ROOT PO Take by mouth daily.     [START ON 06/29/2022] rivaroxaban (XARELTO) 20 MG TABS tablet Take 1 tablet (20 mg total) by mouth daily with supper. 30 tablet 11   RIVAROXABAN (XARELTO) VTE STARTER PACK (15 & 20 MG) Follow package directions: Take one 15mg  tablet by mouth twice a day. On day 22, switch to one 20mg  tablet once a day. Take with food. 51 each 0   No current facility-administered medications for this visit.    No Known Allergies  REVIEW OF SYSTEMS (negative unless checked):   Cardiac:  []   Chest pain or chest pressure? []  Shortness of breath upon activity? []  Shortness of breath when lying flat? []  Irregular heart rhythm?  Vascular:  []  Pain in calf, thigh, or hip brought on by walking? []  Pain in feet at night that wakes you up from your sleep? []  Blood clot in your veins? [x]  Leg swelling?  Pulmonary:  []  Oxygen at home? []  Productive cough? []  Wheezing?  Neurologic:  []  Sudden weakness in arms or legs? []  Sudden numbness in arms or legs? []  Sudden onset of difficult speaking or slurred speech? []  Temporary loss of vision in one eye? []  Problems with dizziness?  Gastrointestinal:  []  Blood in stool? []  Vomited blood?  Genitourinary:  []  Burning when urinating? []  Blood in  urine?  Psychiatric:  []  Major depression  Hematologic:  []  Bleeding problems? []  Problems with blood clotting?  Dermatologic:  []  Rashes or ulcers?  Constitutional:  []  Fever or chills?  Ear/Nose/Throat:  []  Change in hearing? []  Nose bleeds? []  Sore throat?  Musculoskeletal:  []  Back pain? []  Joint pain? []  Muscle pain?   Physical Examination     Vitals:   06/01/22 0830  BP: 116/74  Pulse: 67  Resp: 20  Temp: 98.2 F (36.8 C)  SpO2: 100%  Weight: 117 lb (53.1 kg)  Height: 5\' 1"  (1.549 m)   Body mass index is 22.11 kg/m.  General:  WDWN in NAD; vital signs documented above Gait: Not observed HENT: WNL, normocephalic Pulmonary: normal non-labored breathing  Cardiac: regular Abdomen: soft, NT, no masses Skin: without rashes Vascular Exam/Pulses: palpable DP/PT pulses bilaterally Extremities: Trace right lower extremity edema.  No ulcerations, ischemic changes, or cellulitis Musculoskeletal: no muscle wasting or atrophy  Neurologic: A&O X 3;  No focal weakness or paresthesias are detected Psychiatric:  The pt has Normal affect.  Non-invasive Vascular Imaging   Right IVC/Iliac Duplex (06/01/2022):  Patent IVC and iliac veins with traces of nonocclusive chronic thrombus in the iliac veins.  No acute thrombus  RLE DVT Study (06/01/2022): No acute DVT in the right lower extremity.  Chronic, recanalized thrombus in the right common femoral and popliteal veins   Medical Decision Making   Nicole Hodge is a 22 y.o. female who presents for surveillance s/p RLE DVT in 2020  Based on the patient's duplex studies, there is no acute DVT in the right iliac veins or right lower extremity.  There is still chronic, nonocclusive thrombus in the iliac veins, femoral vein, and popliteal vein.  These findings have been present on prior ultrasounds The patient still has intermittent right lower extremity swelling, which is controlled with her compression stockings,  elevation, and exercise.  She denies any right lower extremity pain or ulcerations She ran out of her Xarelto earlier this year and could not get any refills due to a gap in Medicaid coverage.  She now has Medicaid coverage again.  Since it has been a few months that she is taking her Xarelto I will have to place her on a starter pack again.  She will take the starter pack for 1 month and then continue 20 mg Xarelto maintenance dose daily She has some trace right lower extremity edema.  Her lower extremities are warm and well-perfused She can follow-up with our office in 1 year with repeat right IVC/iliac duplex and right DVT duplex   Erica Osuna Sharin Mons, PA-C [facility] Office: [phone]  06/01/2022, 1:50 PM  Call MD: Karin Lieu

## 2022-06-09 ENCOUNTER — Other Ambulatory Visit: Payer: Self-pay

## 2022-06-09 DIAGNOSIS — I82421 Acute embolism and thrombosis of right iliac vein: Secondary | ICD-10-CM

## 2022-06-09 DIAGNOSIS — I82521 Chronic embolism and thrombosis of right iliac vein: Secondary | ICD-10-CM

## 2022-06-13 ENCOUNTER — Encounter (HOSPITAL_COMMUNITY): Payer: Self-pay

## 2022-06-13 ENCOUNTER — Inpatient Hospital Stay (HOSPITAL_COMMUNITY)
Admission: EM | Admit: 2022-06-13 | Discharge: 2022-06-14 | Disposition: A | Discharge status: Home / Self Care | Payer: Medicaid Other | Attending: Obstetrics and Gynecology | Admitting: Obstetrics and Gynecology

## 2022-06-13 ENCOUNTER — Other Ambulatory Visit: Payer: Self-pay

## 2022-06-13 ENCOUNTER — Inpatient Hospital Stay (HOSPITAL_COMMUNITY): Payer: Medicaid Other

## 2022-06-13 DIAGNOSIS — Z3A01 Less than 8 weeks gestation of pregnancy: Secondary | ICD-10-CM | POA: Diagnosis not present

## 2022-06-13 DIAGNOSIS — Z86718 Personal history of other venous thrombosis and embolism: Secondary | ICD-10-CM | POA: Insufficient documentation

## 2022-06-13 DIAGNOSIS — O200 Threatened abortion: Secondary | ICD-10-CM | POA: Diagnosis not present

## 2022-06-13 DIAGNOSIS — Z7901 Long term (current) use of anticoagulants: Secondary | ICD-10-CM | POA: Insufficient documentation

## 2022-06-13 DIAGNOSIS — O3680X0 Pregnancy with inconclusive fetal viability, not applicable or unspecified: Secondary | ICD-10-CM

## 2022-06-13 DIAGNOSIS — O209 Hemorrhage in early pregnancy, unspecified: Secondary | ICD-10-CM | POA: Diagnosis not present

## 2022-06-13 LAB — CBC WITH DIFFERENTIAL/PLATELET
Abs Immature Granulocytes: 0.01 10*3/uL (ref 0.00–0.07)
Basophils Absolute: 0 10*3/uL (ref 0.0–0.1)
Basophils Relative: 0 %
Eosinophils Absolute: 0.1 10*3/uL (ref 0.0–0.5)
Eosinophils Relative: 1 %
HCT: 37.1 % (ref 36.0–46.0)
Hemoglobin: 12.3 g/dL (ref 12.0–15.0)
Immature Granulocytes: 0 %
Lymphocytes Relative: 30 %
Lymphs Abs: 2.8 10*3/uL (ref 0.7–4.0)
MCH: 28.9 pg (ref 26.0–34.0)
MCHC: 33.2 g/dL (ref 30.0–36.0)
MCV: 87.3 fL (ref 80.0–100.0)
Monocytes Absolute: 0.5 10*3/uL (ref 0.1–1.0)
Monocytes Relative: 6 %
Neutro Abs: 5.7 10*3/uL (ref 1.7–7.7)
Neutrophils Relative %: 63 %
Platelets: 266 10*3/uL (ref 150–400)
RBC: 4.25 MIL/uL (ref 3.87–5.11)
RDW: 16 % — ABNORMAL HIGH (ref 11.5–15.5)
WBC: 9.2 10*3/uL (ref 4.0–10.5)
nRBC: 0 % (ref 0.0–0.2)

## 2022-06-13 LAB — WET PREP, GENITAL
Sperm: NONE SEEN
Trich, Wet Prep: NONE SEEN
WBC, Wet Prep HPF POC: >=10 — AB (ref <10)
Yeast Wet Prep HPF POC: NONE SEEN

## 2022-06-13 LAB — BASIC METABOLIC PANEL
Anion gap: 9 (ref 5–15)
BUN: 12 mg/dL (ref 6–20)
CO2: 22 mmol/L (ref 22–32)
Calcium: 9 mg/dL (ref 8.9–10.3)
Chloride: 103 mmol/L (ref 98–111)
Creatinine, Ser: 0.72 mg/dL (ref 0.44–1.00)
GFR, Estimated: >60 mL/min (ref >60)
Glucose, Bld: 88 mg/dL (ref 70–99)
Potassium: 3.7 mmol/L (ref 3.5–5.1)
Sodium: 134 mmol/L — ABNORMAL LOW (ref 135–145)

## 2022-06-13 LAB — HCG, QUANTITATIVE, PREGNANCY: hCG, Beta Chain, Quant, S: 142 m[IU]/mL — ABNORMAL HIGH (ref <5)

## 2022-06-13 LAB — ABO/RH: ABO/RH(D): B POS

## 2022-06-13 LAB — POC URINE PREG, ED: Preg Test, Ur: POSITIVE — AB

## 2022-06-13 NOTE — MAU Note (Signed)
.  Nicole Hodge is a 22 y.o. at [redacted]w[redacted]d here in MAU reporting abdominal and vag bleeding since 0900. Went to Main ED and was sent to MAU. Pt reports here bleeding got heavier as the day progressed with some clots. Pt worked today LMP: 04/17/22 but had some spotting in April Onset of complaint: 0900 Pain score: 7 Vitals:   06/13/22 2324 06/13/22 2325  BP:  127/72  Pulse: 61   Resp: 16   Temp: 98 F (36.7 C)   SpO2: 100%      FHT:n/a Lab orders placed from triage:  u/a

## 2022-06-13 NOTE — ED Provider Triage Note (Cosign Needed Addendum)
Emergency Medicine Provider OB Triage Evaluation Note  Nicole Hodge is a 22 y.o. female, G1P0000, at Unknown gestation who presents to the emergency department with complaints of abdominal cramping and vaginal bleeding onset at 0800 today. Took home pregnancy test 2 weeks ago which was positive. LMP 05/13/22, though patient states this was more like "spotting". No fevers, syncope. Restarted Xarelto for VTE 4 days ago.  Review of  Systems  Positive: as above Negative: as above  Physical Exam  BP (!) 143/74 (BP Location: Left Arm)   Pulse 75   Temp 98.7 F (37.1 C) (Oral)   Resp 16   Wt 53.1 kg   SpO2 99%   BMI 22.11 kg/m  General: Awake, no distress  HEENT: Atraumatic  Resp: Normal effort  Cardiac: Normal rate Abd: Nondistended MSK: Moves all extremities without difficulty Neuro: Speech clear  Medical Decision Making  Pt evaluated for pregnancy concern and is stable for transfer to MAU. Pt is in agreement with plan for transfer.  10:45 PM Discussed with Dr. Alvester Morin, who accepts patient in transfer.  Clinical Impression   1. Threatened miscarriage        Antony Madura, PA-C 06/13/22 2250    Antony Madura, PA-C 06/13/22 2258

## 2022-06-13 NOTE — ED Triage Notes (Signed)
Pt arrives with c/o lower ABD pain that started today. Per pt, she had a positive pregnancy test 2 weeks ago. Per pt, she has been having vaginal bleeding and clots today.

## 2022-06-14 DIAGNOSIS — Z3A01 Less than 8 weeks gestation of pregnancy: Secondary | ICD-10-CM | POA: Diagnosis not present

## 2022-06-14 DIAGNOSIS — Z86718 Personal history of other venous thrombosis and embolism: Secondary | ICD-10-CM | POA: Diagnosis not present

## 2022-06-14 DIAGNOSIS — O209 Hemorrhage in early pregnancy, unspecified: Secondary | ICD-10-CM | POA: Diagnosis not present

## 2022-06-14 DIAGNOSIS — O200 Threatened abortion: Secondary | ICD-10-CM

## 2022-06-14 LAB — URINALYSIS, ROUTINE W REFLEX MICROSCOPIC
Bilirubin Urine: NEGATIVE
Glucose, UA: NEGATIVE mg/dL
Ketones, ur: NEGATIVE mg/dL
Leukocytes,Ua: NEGATIVE
Nitrite: NEGATIVE
Protein, ur: 30 mg/dL — AB
RBC / HPF: >50 RBC/hpf (ref 0–5)
Specific Gravity, Urine: 1.029 (ref 1.005–1.030)
pH: 5 (ref 5.0–8.0)

## 2022-06-14 LAB — GC/CHLAMYDIA PROBE AMP (~~LOC~~) NOT AT ARMC
Chlamydia: NEGATIVE
Comment: NEGATIVE
Comment: NORMAL
Neisseria Gonorrhea: NEGATIVE

## 2022-06-14 MED ORDER — ENOXAPARIN SODIUM 40 MG/0.4ML IJ SOSY: 40.0000 mg | PREFILLED_SYRINGE | INTRAMUSCULAR | 1 refills | Status: DC

## 2022-06-14 NOTE — Discharge Instructions (Signed)
Return to care  If you have heavier bleeding that soaks through more than 2 pads per hour for an hour or more If you bleed so much that you feel like you might pass out or you do pass out If you have significant abdominal pain that is not improved with Tylenol   

## 2022-06-14 NOTE — MAU Provider Note (Signed)
History     [id number]  Arrival date and time: 06/13/22 1843    Chief Complaint  Patient presents with   Abdominal Pain   Vaginal Bleeding     HPI Nicole Hodge is a 22 y.o. G1P0000 at [redacted]w[redacted]d by LMP who presents for vaginal bleeding & pelvic pain. Symptoms started this morning. Reports bilateral lower abdominal cramping. Has had some bright red bleeding that was spotting but has increased. Is not saturating pads. Has passed some small clots.  She is currently on xarelto due to history of DVTs.    --/--/B POS (05/14 1932)  OB History     Gravida  1   Para  0   Term  0   Preterm  0   AB  0   Living  0      SAB  0   IAB  0   Ectopic  0   Multiple  0   Live Births  0           Past Medical History:  Diagnosis Date   DVT (deep venous thrombosis) (HCC)     Past Surgical History:  Procedure Laterality Date   IVC VENOGRAPHY N/A 06/06/2018   Procedure: IVC Venography;  Surgeon: Maeola Harman, MD;  Location: [facility] INVASIVE CV LAB;  Service: Cardiovascular;  Laterality: N/A;   PERIPHERAL VASCULAR THROMBECTOMY N/A 06/06/2018   Procedure: PERIPHERAL VASCULAR THROMBECTOMY;  Surgeon: Maeola Harman, MD;  Location: [facility] INVASIVE CV LAB;  Service: Cardiovascular;  Laterality: N/A;    Family History  Problem Relation Age of Onset   Healthy Mother    Healthy Father    Cerebral palsy Sister    Healthy Brother     Social History   Socioeconomic History   Marital status: Single    Spouse name: Not on file   Number of children: 0   Years of education: Not on file   Highest education level: Not on file  Occupational History   Not on file  Tobacco Use   Smoking status: Never   Smokeless tobacco: Never  Vaping Use   Vaping Use: Never used  Substance and Sexual Activity   Alcohol use: No   Drug use: Yes    Types: Marijuana   Sexual activity: Yes    Birth control/protection: None  Other Topics Concern   Not on file  Social History  Narrative   Not on file   Social Determinants of Health   Financial Resource Strain: Not on file  Food Insecurity: Not on file  Transportation Needs: Not on file  Physical Activity: Not on file  Stress: Not on file  Social Connections: Not on file  Intimate Partner Violence: Not on file    No Known Allergies  No current facility-administered medications on file prior to encounter.   No current outpatient medications on file prior to encounter.     ROS Pertinent positives and negative per HPI, all others reviewed and negative  Physical Exam   BP 123/74 (BP Location: Right Arm)   Pulse 67   Temp 98 F (36.7 C)   Resp 16   Ht 5\' 1"  (1.549 m)   Wt 54.9 kg   LMP 05/13/2022   SpO2 100%   BMI 22.86 kg/m   Patient Vitals for the past 24 hrs:  BP Temp Temp src Pulse Resp SpO2 Height Weight  06/14/22 0139 123/74 -- -- 67 16 -- -- --  06/13/22 2325 127/72 -- -- -- -- -- -- --  06/13/22 2324 -- 98 F (36.7 C) -- 61 16 100 % 5\' 1"  (1.549 m) 54.9 kg  06/13/22 1921 -- -- -- -- -- -- -- 53.1 kg  06/13/22 1900 (!) 143/74 98.7 F (37.1 C) Oral 75 16 99 % -- --    Physical Exam Vitals and nursing note reviewed.  Constitutional:      General: She is not in acute distress.    Appearance: She is well-developed. She is not ill-appearing.  HENT:     Head: Normocephalic and atraumatic.  Eyes:     General: No scleral icterus.       Right eye: No discharge.        Left eye: No discharge.     Conjunctiva/sclera: Conjunctivae normal.  Pulmonary:     Effort: Pulmonary effort is normal. No respiratory distress.  Neurological:     General: No focal deficit present.     Mental Status: She is alert.  Psychiatric:        Mood and Affect: Mood normal.        Behavior: Behavior normal.      Labs Results for orders placed or performed during the hospital encounter of 06/13/22 (from the past 24 hour(s))  ABO/Rh     Status: None   Collection Time: 06/13/22  7:32 PM  Result Value  Ref Range   ABO/RH(D) B POS    No rh immune globuloin      NOT A RH IMMUNE GLOBULIN CANDIDATE, PT RH POSITIVE Performed at [facility], [address], [place], [place] [address]   CBC with Differential     Status: Abnormal   Collection Time: 06/13/22  8:19 PM  Result Value Ref Range   WBC 9.2 4.0 - 10.5 K/uL   RBC 4.25 3.87 - 5.11 MIL/uL   Hemoglobin 12.3 12.0 - 15.0 g/dL   HCT 19.1 47.8 - 29.5 %   MCV 87.3 80.0 - 100.0 fL   MCH 28.9 26.0 - 34.0 pg   MCHC 33.2 30.0 - 36.0 g/dL   RDW 62.1 (H) 30.8 - 65.7 %   Platelets 266 150 - 400 K/uL   nRBC 0.0 0.0 - 0.2 %   Neutrophils Relative % 63 %   Neutro Abs 5.7 1.7 - 7.7 K/uL   Lymphocytes Relative 30 %   Lymphs Abs 2.8 0.7 - 4.0 K/uL   Monocytes Relative 6 %   Monocytes Absolute 0.5 0.1 - 1.0 K/uL   Eosinophils Relative 1 %   Eosinophils Absolute 0.1 0.0 - 0.5 K/uL   Basophils Relative 0 %   Basophils Absolute 0.0 0.0 - 0.1 K/uL   Immature Granulocytes 0 %   Abs Immature Granulocytes 0.01 0.00 - 0.07 K/uL  Basic metabolic panel     Status: Abnormal   Collection Time: 06/13/22  8:19 PM  Result Value Ref Range   Sodium 134 (L) 135 - 145 mmol/L   Potassium 3.7 3.5 - 5.1 mmol/L   Chloride 103 98 - 111 mmol/L   CO2 22 22 - 32 mmol/L   Glucose, Bld 88 70 - 99 mg/dL   BUN 12 6 - 20 mg/dL   Creatinine, Ser 8.46 0.44 - 1.00 mg/dL   Calcium 9.0 8.9 - 96.2 mg/dL   GFR, Estimated >95 >28 mL/min   Anion gap 9 5 - 15  hCG, quantitative, pregnancy     Status: Abnormal   Collection Time: 06/13/22  8:19 PM  Result Value Ref Range   hCG, Beta  Chain, Quant, S 142 (H) <5 mIU/mL  POC Urine Pregnancy, ED (not at [facility] or [facility])     Status: Abnormal   Collection Time: 06/13/22  8:49 PM  Result Value Ref Range   Preg Test, Ur POSITIVE (A) NEGATIVE  Wet prep, genital     Status: Abnormal   Collection Time: 06/13/22 11:31 PM   Specimen: Vaginal  Result Value Ref Range   Yeast Wet Prep HPF POC NONE SEEN NONE SEEN   Trich, Wet Prep NONE  SEEN NONE SEEN   Clue Cells Wet Prep HPF POC PRESENT (A) NONE SEEN   WBC, Wet Prep HPF POC >=10 (A) <10   Sperm NONE SEEN   Urinalysis, Routine w reflex microscopic -Urine, Clean Catch     Status: Abnormal   Collection Time: 06/13/22 11:41 PM  Result Value Ref Range   Color, Urine YELLOW YELLOW   APPearance HAZY (A) CLEAR   Specific Gravity, Urine 1.029 1.005 - 1.030   pH 5.0 5.0 - 8.0   Glucose, UA NEGATIVE NEGATIVE mg/dL   Hgb urine dipstick LARGE (A) NEGATIVE   Bilirubin Urine NEGATIVE NEGATIVE   Ketones, ur NEGATIVE NEGATIVE mg/dL   Protein, ur 30 (A) NEGATIVE mg/dL   Nitrite NEGATIVE NEGATIVE   Leukocytes,Ua NEGATIVE NEGATIVE   RBC / HPF >50 0 - 5 RBC/hpf   WBC, UA 0-5 0 - 5 WBC/hpf   Bacteria, UA FEW (A) NONE SEEN   Squamous Epithelial / HPF 0-5 0 - 5 /HPF   Mucus PRESENT     Imaging US OB LESS THAN 14 WEEKS WITH OB TRANSVAGINAL  Result Date: 06/14/2022 CLINICAL DATA:  [id number], with vaginal bleeding in the first trimester. Beta HCG is 142. EXAM: OBSTETRIC <14 WK [place] AND TRANSVAGINAL OB COLOR DOPPLER ULTRASOUND OF OVARIES TECHNIQUE: Both transabdominal and transvaginal ultrasound examinations were performed for complete evaluation of the gestation as well as the maternal uterus, adnexal regions, and pelvic cul-de-sac. Transvaginal technique was performed to assess early pregnancy. Color doppler ultrasound was utilized to evaluate blood flow to the ovaries. COMPARISON:  None Available. FINDINGS: Intrauterine gestational sac: None. Yolk sac:  Not Visualized. Embryo:  Not Visualized. Cardiac Activity: Not Visualized. MSD: None. CRL: None. Subchorionic hemorrhage:  None visualized. Maternal uterus/adnexae: The uterus is anteverted and measures 7 x 3.8 by 4.6 cm. No wall mass is seen. The cervix is closed. The endometrial complex is small in caliber at 2 mm. Right ovary is 2.3 x 1.7 x 2.2 cm (volume 4.2 mL), unremarkable apart from a 1.1 cm dominant simple follicle. The left ovary measures  3.2 x 2.5 x 2.0 cm (volume 8.4 mL). There is a 2 cm hemorrhagic follicle, with no color flow. The left ovary is otherwise unremarkable. There is mild free fluid but it does not appear complex. No paraovarian mass is seen. Color doppler evaluation of both ovaries demonstrates normal appearing color flow. IMPRESSION: 1. No living pregnancy identified. Differential diagnosis is between early dates, failed pregnancy with missed abortion, and ectopic pregnancy. Follow-up as indicated. 2. Mild free pelvic fluid but appears anechoic and uncomplicated. Correlate clinically with hematocrit. 3. 2 cm hemorrhagic left ovarian follicle. No evidence of ovarian torsion or suspicious lesion. Electronically Signed   By: Almira Bar M.D.   On: 06/14/2022 00:40    MAU Course  Procedures Lab Orders         Wet prep, genital         CBC with Differential  Basic metabolic panel         hCG, quantitative, pregnancy         Urinalysis, Routine w reflex microscopic -Urine, Clean Catch         POC Urine Pregnancy, ED (not at [facility] or [facility])    Meds ordered this encounter  Medications   enoxaparin (LOVENOX) 40 MG/0.4ML injection    Sig: Inject 0.4 mLs (40 mg total) into the skin daily.    Dispense:  30 mL    Refill:  1    Order Specific Question:   Supervising Provider    Answer:   CONSTANT, PEGGY [[id number]]   Imaging Orders         US OB LESS THAN 14 WEEKS WITH OB TRANSVAGINAL     MDM moderate  Assessment and Plan   1. Threatened miscarriage   2. Pregnancy of unknown anatomic location   3. History of deep vein thrombosis (DVT) of lower extremity   4. [redacted] weeks gestation of pregnancy    -Patient reports abdominal cramping & vaginal bleeding. She is rh positive. HCG today is only 142; ultrasound shows no IUP. Scheduled for stat HCG in the office on Friday.  -Reviewed meds with Dr. Jolayne Panther who recommends switching patient from xarelto to lovenox 40 daily.  -Discussed with patient & given written instructions  regarding administration of pre filled sq injections.   Dispo: discharged to home in stable condition.   Discharge Instructions     Discharge patient   Complete by: As directed    Discharge disposition: 01-Home or Self Care   Discharge patient date: 06/14/2022       Judeth Horn, NP 06/14/22 2:52 AM  Allergies as of 06/14/2022   No Known Allergies      Medication List     STOP taking these medications    MACA ROOT PO   rivaroxaban 20 MG Tabs tablet Commonly known as: XARELTO   Rivaroxaban Starter Pack (15 mg and 20 mg) Commonly known as: XARELTO STARTER PACK       TAKE these medications    enoxaparin 40 MG/0.4ML injection Commonly known as: LOVENOX Inject 0.4 mLs (40 mg total) into the skin daily.

## 2022-06-16 ENCOUNTER — Ambulatory Visit (INDEPENDENT_AMBULATORY_CARE_PROVIDER_SITE_OTHER): Payer: Medicaid Other | Admitting: General Practice

## 2022-06-16 ENCOUNTER — Other Ambulatory Visit: Payer: Self-pay

## 2022-06-16 VITALS — BP 133/65 | HR 65 | Ht 61.0 in | Wt 121.0 lb

## 2022-06-16 DIAGNOSIS — Z3A01 Less than 8 weeks gestation of pregnancy: Secondary | ICD-10-CM

## 2022-06-16 DIAGNOSIS — O3680X0 Pregnancy with inconclusive fetal viability, not applicable or unspecified: Secondary | ICD-10-CM

## 2022-06-16 LAB — BETA HCG QUANT (REF LAB): hCG Quant: 89 m[IU]/mL

## 2022-06-16 NOTE — Progress Notes (Signed)
Beta HCG Follow-up Visit  Milessa A Mangieri presents to [facility] for follow-up beta HCG lab. She was seen in MAU for  vaginal bleeding & cramping  on 5/14. Patient reports  light bleeding and less cramping  today. Discussed with patient that we are following beta HCG levels today. Results will be back in approximately 2 hours. Valid contact number for patient confirmed. I will call the patient with results.   Beta HCG results:         5/14          142          5/17           89      Results and patient history reviewed with Dr Roney Mans, who states decreasing bhcg levels consistent with SAB. Patient should follow up in 1 week for repeat bhcg and should switch back to Xarelto. Patient called and informed of plan for follow-up. Offered follow up appt with provider & patient would like that scheduled- told her someone from our front office will reach out to her with an appt. Also recommended she reach out to her vascular provider to give them the update on her medication changes.  Marylynn Pearson 06/16/2022 8:45 AM

## 2022-06-23 ENCOUNTER — Other Ambulatory Visit: Payer: Medicaid Other

## 2022-06-23 ENCOUNTER — Other Ambulatory Visit: Payer: Self-pay | Admitting: General Practice

## 2022-06-23 ENCOUNTER — Other Ambulatory Visit: Payer: Self-pay

## 2022-06-23 DIAGNOSIS — O039 Complete or unspecified spontaneous abortion without complication: Secondary | ICD-10-CM

## 2022-06-24 LAB — BETA HCG QUANT (REF LAB): hCG Quant: 5 m[IU]/mL

## 2022-06-27 ENCOUNTER — Ambulatory Visit: Payer: Medicaid Other | Admitting: Family Medicine

## 2022-07-11 ENCOUNTER — Telehealth: Payer: Self-pay | Admitting: *Deleted

## 2022-07-11 NOTE — Telephone Encounter (Signed)
Patient called  stating her OB-GYN had instructed her to resume the Xarelto as she had had a miscarriage, asking her to contact Dr Chestine Spore to confirm it was ok to resume.  Dr Chestine Spore 's instructions are for patient to continue Xarelto until she has completed her family.  I spoke with patient and she voiced understanding of the instructions.

## 2022-10-23 ENCOUNTER — Ambulatory Visit (HOSPITAL_COMMUNITY)
Admission: EM | Admit: 2022-10-23 | Discharge: 2022-10-23 | Disposition: A | Discharge status: Home / Self Care | Payer: Medicaid Other

## 2022-10-24 ENCOUNTER — Ambulatory Visit (INDEPENDENT_AMBULATORY_CARE_PROVIDER_SITE_OTHER): Payer: Self-pay

## 2022-10-24 ENCOUNTER — Encounter (HOSPITAL_COMMUNITY): Payer: Self-pay | Admitting: Hematology

## 2022-10-24 DIAGNOSIS — Z86718 Personal history of other venous thrombosis and embolism: Secondary | ICD-10-CM

## 2022-10-24 DIAGNOSIS — Z3490 Encounter for supervision of normal pregnancy, unspecified, unspecified trimester: Secondary | ICD-10-CM

## 2022-10-24 DIAGNOSIS — Z3200 Encounter for pregnancy test, result unknown: Secondary | ICD-10-CM

## 2022-10-24 DIAGNOSIS — Z3201 Encounter for pregnancy test, result positive: Secondary | ICD-10-CM

## 2022-10-24 LAB — POCT PREGNANCY, URINE: Preg Test, Ur: POSITIVE — AB

## 2022-10-24 MED ORDER — ENOXAPARIN SODIUM 40 MG/0.4ML IJ SOSY: 80.0000 mg | PREFILLED_SYRINGE | INTRAMUSCULAR | 2 refills | Status: DC

## 2022-10-24 NOTE — Progress Notes (Signed)
Possible Pregnancy  Here today for pregnancy confirmation. Urine left in office and patient called with results. UPT in office today is positive. Miscarriage May 2024 and HCG trended to 5. Reports single day of bleeding on 09/25/22. Pt reports first positive home UPT 3 days ago. No negative tests since miscarriage. History of DVT in 2019 or 2020 and has been on daily Xarelto. Hasn't taken since 09/21/22. Will send to provider for review of need for anticoagulation during pregnancy.  Dating ultrasound scheduled for 10/30/22.  Marjo Bicker, RN 10/24/2022  5:23 PM

## 2022-10-24 NOTE — Addendum Note (Signed)
Addended by: Harlon Ditty on: 10/24/2022 06:22 PM   Modules accepted: Orders

## 2022-10-30 ENCOUNTER — Ambulatory Visit (INDEPENDENT_AMBULATORY_CARE_PROVIDER_SITE_OTHER): Payer: Self-pay

## 2022-10-30 ENCOUNTER — Other Ambulatory Visit: Payer: Self-pay

## 2022-10-30 DIAGNOSIS — Z3A01 Less than 8 weeks gestation of pregnancy: Secondary | ICD-10-CM

## 2022-10-30 DIAGNOSIS — Z3491 Encounter for supervision of normal pregnancy, unspecified, first trimester: Secondary | ICD-10-CM

## 2022-10-31 ENCOUNTER — Telehealth: Payer: Self-pay

## 2022-10-31 DIAGNOSIS — O3680X0 Pregnancy with inconclusive fetal viability, not applicable or unspecified: Secondary | ICD-10-CM

## 2022-10-31 NOTE — Telephone Encounter (Signed)
Pt called and left VM requesting results. Following results received from provider:    Lorriane Shire, MD  P [facility] Clinical Pool Needs viability scan in 10-14 days   Called pt to review results and provider recommendation. [place] scheduled for 11/13/22.

## 2022-11-06 ENCOUNTER — Inpatient Hospital Stay (HOSPITAL_COMMUNITY)
Admission: AD | Admit: 2022-11-06 | Discharge: 2022-11-06 | Disposition: A | Discharge status: Home / Self Care | Payer: Medicaid Other | Attending: Obstetrics and Gynecology | Admitting: Obstetrics and Gynecology

## 2022-11-06 ENCOUNTER — Encounter (HOSPITAL_COMMUNITY): Payer: Self-pay | Admitting: *Deleted

## 2022-11-06 DIAGNOSIS — Z86718 Personal history of other venous thrombosis and embolism: Secondary | ICD-10-CM | POA: Insufficient documentation

## 2022-11-06 DIAGNOSIS — Z3A01 Less than 8 weeks gestation of pregnancy: Secondary | ICD-10-CM

## 2022-11-06 DIAGNOSIS — Z3A12 12 weeks gestation of pregnancy: Secondary | ICD-10-CM | POA: Insufficient documentation

## 2022-11-06 DIAGNOSIS — Z87891 Personal history of nicotine dependence: Secondary | ICD-10-CM | POA: Diagnosis not present

## 2022-11-06 DIAGNOSIS — O209 Hemorrhage in early pregnancy, unspecified: Secondary | ICD-10-CM | POA: Diagnosis present

## 2022-11-06 NOTE — Discharge Instructions (Signed)
Your ultrasound shows a normal pregnancy. We discussed that vaginal bleeding in the first trimester is common, and that 80-90% of patients will go on to have a normal pregnancy with a live delivery. The remainder are at increased risk for miscarriage, unfortunately there are no known interventions to mitigate this risk. We discussed return precautions including crescendo abdominal pain, heavy vaginal bleeding soaking >1 pad/hour, and fever.

## 2022-11-06 NOTE — MAU Note (Addendum)
Nicole Hodge is a 22 y.o. at Unknown here in MAU reporting: had miscarriage in May.  Reports LMP July, bled one day in Aug.  Confirmed 9/24.  Had light bleeding, between spotting and scant on chart. LMP: 7/9 Onset of complaint: this morning Pain score: none Vitals:   11/06/22 1257  BP: (!) 119/58  Pulse: 64  Resp: 16  Temp: 98.1 F (36.7 C)  SpO2: 100%     Unable to hear FH with doppler Lab orders placed from triage:

## 2022-11-06 NOTE — MAU Provider Note (Signed)
History     [id number]  Arrival date and time: 11/06/22 1233    Chief Complaint  Patient presents with   Vaginal Bleeding     HPI Nicole Hodge is a 22 y.o. at [redacted]w[redacted]d, who presents for vaginal bleeding.   Patient had likely IUP confirmed on [place] from 10/30/22 with IUGS seen Reports earlier today had some light vaginal spotting No abdominal pain No vaginal discharge No burning or pain with urination No recent intercourse  --/--/B POS (05/14 1932)  OB History     Gravida  2   Para  0   Term  0   Preterm  0   AB  1   Living  0      SAB  1   IAB  0   Ectopic  0   Multiple  0   Live Births  0           Past Medical History:  Diagnosis Date   DVT (deep venous thrombosis) (HCC)     Past Surgical History:  Procedure Laterality Date   IVC VENOGRAPHY N/A 06/06/2018   Procedure: IVC Venography;  Surgeon: Maeola Harman, MD;  Location: [facility] INVASIVE CV LAB;  Service: Cardiovascular;  Laterality: N/A;   PERIPHERAL VASCULAR THROMBECTOMY N/A 06/06/2018   Procedure: PERIPHERAL VASCULAR THROMBECTOMY;  Surgeon: Maeola Harman, MD;  Location: [facility] INVASIVE CV LAB;  Service: Cardiovascular;  Laterality: N/A;    Family History  Problem Relation Age of Onset   Other Mother        unknown cause of deather, age 42   Deep vein thrombosis Mother    Healthy Father    Cerebral palsy Sister    Healthy Brother     Social History   Socioeconomic History   Marital status: Single    Spouse name: Not on file   Number of children: 0   Years of education: Not on file   Highest education level: Not on file  Occupational History   Not on file  Tobacco Use   Smoking status: Never   Smokeless tobacco: Never   Tobacco comments:    Quit vaping 2023  Vaping Use   Vaping status: Former  Substance and Sexual Activity   Alcohol use: Not Currently    Comment: quit with preg   Drug use: Not Currently    Types: Marijuana    Comment: stopped with preg    Sexual activity: Yes    Birth control/protection: None  Other Topics Concern   Not on file  Social History Narrative   Not on file   Social Determinants of Health   Financial Resource Strain: Not on file  Food Insecurity: Not on file  Transportation Needs: Not on file  Physical Activity: Not on file  Stress: Not on file  Social Connections: Not on file  Intimate Partner Violence: Not on file    No Known Allergies  No current facility-administered medications on file prior to encounter.   Current Outpatient Medications on File Prior to Encounter  Medication Sig Dispense Refill   enoxaparin (LOVENOX) 40 MG/0.4ML injection Inject 0.8 mLs (80 mg total) into the skin daily. 24 mL 2   rivaroxaban (XARELTO) 10 MG TABS tablet Take 10 mg by mouth daily. Unsure of dose- not since Aug       ROS Pertinent positives and negative per HPI, all others reviewed and negative  Physical Exam   BP (!) 119/58 (BP Location: Right Arm)   Pulse 64  Temp 98.1 F (36.7 C) (Oral)   Resp 16   Ht 5\' 1"  (1.549 m)   Wt 55.3 kg   LMP 08/08/2022 Comment: bled one day in Aug  SpO2 100%   BMI 23.03 kg/m   Patient Vitals for the past 24 hrs:  BP Temp Temp src Pulse Resp SpO2 Height Weight  11/06/22 1257 (!) 119/58 98.1 F (36.7 C) Oral 64 16 100 % 5\' 1"  (1.549 m) 55.3 kg    Physical Exam Vitals reviewed.  Constitutional:      General: She is not in acute distress.    Appearance: She is well-developed. She is not diaphoretic.  Eyes:     General: No scleral icterus. Pulmonary:     Effort: Pulmonary effort is normal. No respiratory distress.  Abdominal:     General: There is no distension.     Palpations: Abdomen is soft.     Tenderness: There is no abdominal tenderness. There is no guarding or rebound.  Skin:    General: Skin is warm and dry.  Neurological:     Mental Status: She is alert.     Coordination: Coordination normal.      Cervical Exam    Bedside Ultrasound Pt  informed that the ultrasound is considered a limited OB ultrasound and is not intended to be a complete ultrasound exam.  Patient also informed that the ultrasound is not being completed with the intent of assessing for fetal or placental anomalies or any pelvic abnormalities.  Explained that the purpose of today's ultrasound is to assess for  viability.  Patient acknowledges the purpose of the exam and the limitations of the study.      My interpretation: First trimester findings: Intrauterine gestational sac seen: yes Gestational sac summary: fetal pole seen, yolk sac seen Fetal cardiac activity: present, but very small and unable to assess by M mode, appeared subjectively normal   Labs No results found for this or any previous visit (from the past 24 hour(s)).  Imaging No results found.  MAU Course  Procedures Lab Orders  No laboratory test(s) ordered today   No orders of the defined types were placed in this encounter.  Imaging Orders  No imaging studies ordered today    MDM Moderate (Level 3-4)  Assessment and Plan  #Vaginal bleeding in pregnancy, first trimester #[redacted] weeks gestation of pregnancy US shows viable IUP with cardiac activity. We discussed that vaginal bleeding in the first trimester is common, and that 80-90% of patients will go on to have a normal pregnancy with a live delivery. The remainder are at increased risk for miscarriage, unfortunately there are no known interventions to mitigate this risk. --/--/B POS (05/14 1932), rhogam not indicated. We discussed return precautions including crescendo abdominal pain, heavy vaginal bleeding soaking >1 pad/hour, and fever.   Dispo: discharged to home in stable condition    Venora Maples, MD/MPH 11/06/22 2:28 PM  Allergies as of 11/06/2022   No Known Allergies      Medication List     STOP taking these medications    rivaroxaban 10 MG Tabs tablet Commonly known as: XARELTO       TAKE these  medications    enoxaparin 40 MG/0.4ML injection Commonly known as: LOVENOX Inject 0.8 mLs (80 mg total) into the skin daily.

## 2022-11-13 ENCOUNTER — Encounter (HOSPITAL_COMMUNITY): Payer: Self-pay | Admitting: Hematology

## 2022-11-13 ENCOUNTER — Other Ambulatory Visit: Payer: Medicaid Other

## 2022-11-15 ENCOUNTER — Ambulatory Visit: Payer: Medicaid Other

## 2022-11-15 ENCOUNTER — Ambulatory Visit (INDEPENDENT_AMBULATORY_CARE_PROVIDER_SITE_OTHER): Payer: Medicaid Other | Admitting: Certified Nurse Midwife

## 2022-11-15 ENCOUNTER — Other Ambulatory Visit: Payer: Self-pay

## 2022-11-15 DIAGNOSIS — Z3A01 Less than 8 weeks gestation of pregnancy: Secondary | ICD-10-CM | POA: Diagnosis not present

## 2022-11-15 DIAGNOSIS — O3680X0 Pregnancy with inconclusive fetal viability, not applicable or unspecified: Secondary | ICD-10-CM

## 2022-11-15 DIAGNOSIS — O0289 Other abnormal products of conception: Secondary | ICD-10-CM | POA: Diagnosis not present

## 2022-11-16 ENCOUNTER — Other Ambulatory Visit: Payer: Medicaid Other

## 2022-11-16 ENCOUNTER — Encounter: Payer: Self-pay | Admitting: Certified Nurse Midwife

## 2022-11-17 ENCOUNTER — Telehealth: Payer: Self-pay

## 2022-11-17 NOTE — Progress Notes (Signed)
History:  [name] is a 22 y.o. G2P0010 at64w1d who presents to clinic today for follow up ultrasound, she has had some spotting this early pregnancy and had a viability scan earlier today.   The following portions of the patient's history were reviewed and updated as appropriate: allergies, current medications, family history, past medical history, social history, past surgical history and problem list.  Review of Systems:  Pertinent items noted in HPI and remainder of comprehensive ROS otherwise negative.   Objective:  Physical Exam LMP 08/08/2022 Comment: bled one day in Aug Physical Exam Vitals and nursing note reviewed.  Constitutional:      Appearance: Normal appearance.  Pulmonary:     Effort: Pulmonary effort is normal.  Abdominal:     Palpations: Abdomen is soft.     Tenderness: There is no abdominal tenderness.  Musculoskeletal:        General: Normal range of motion.  Skin:    General: Skin is warm and dry.     Capillary Refill: Capillary refill takes less than 2 seconds.  Neurological:     Mental Status: She is alert and oriented to person, place, and time.  Psychiatric:        Mood and Affect: Mood normal.        Behavior: Behavior normal.    Labs and Imaging Fetal pole measuring [redacted]w[redacted]d, no heartbeat. Heartbeat noted in bedside U/S on 10/7 performed by Dr. Crissie Reese which meets criteria for a failed pregnancy. Last formal ultrasound on 9/30 showed an IUGS w YS but no fetal heartbeat measuring [redacted]w[redacted]d. This is >11 days without development of a FHR, meeting criteria per the Society of Radiologists in Ultrasound Multispecialist Consensus.  Assessment & Plan:  1. Nonviable pregnancy - Reviewed results with patient and gave condolences. - Reviewed options including expectant management, cytotec therapy or D&C. She prefers to avoid surgery but wants to discuss with support people. - Will follow up tomorrow or Friday with phone call to see if she has questions or has  made a decision. - Advised she can switch back to her oral anticoagulant therapy instead of continuing the Lovenox injections.  Edd Arbour, CNM, MSN, IBCLC Certified Nurse Midwife, [facility]

## 2022-11-17 NOTE — Telephone Encounter (Signed)
Called Pt to discuss discision discussed with Edd Arbour, CNM no answer ,left VM.

## 2022-11-20 ENCOUNTER — Telehealth: Payer: Self-pay | Admitting: Family Medicine

## 2022-11-20 DIAGNOSIS — O0289 Other abnormal products of conception: Secondary | ICD-10-CM

## 2022-11-20 DIAGNOSIS — Z86718 Personal history of other venous thrombosis and embolism: Secondary | ICD-10-CM

## 2022-11-20 NOTE — Telephone Encounter (Signed)
Patient called and states she had a conversation with Edd Arbour at previous appointment and she decided that she would like to take the medicine and not have the surgery.

## 2022-11-21 ENCOUNTER — Other Ambulatory Visit: Payer: Self-pay | Admitting: Certified Nurse Midwife

## 2022-11-21 DIAGNOSIS — Z86718 Personal history of other venous thrombosis and embolism: Secondary | ICD-10-CM

## 2022-11-21 MED ORDER — MISOPROSTOL 200 MCG PO TABS: ORAL_TABLET | ORAL | 1 refills | Status: DC

## 2022-11-21 MED ORDER — ONDANSETRON HCL 8 MG PO TABS
8.0000 mg | ORAL_TABLET | Freq: Three times a day (TID) | ORAL | 0 refills | Status: DC | PRN
Start: 2022-11-21 — End: 2023-04-04

## 2022-11-21 MED ORDER — RIVAROXABAN 20 MG PO TABS
20.0000 mg | ORAL_TABLET | Freq: Every day | ORAL | 0 refills | Status: DC
Start: 2022-11-21 — End: 2022-11-22

## 2022-11-21 MED ORDER — IBUPROFEN 600 MG PO TABS
600.0000 mg | ORAL_TABLET | Freq: Four times a day (QID) | ORAL | 0 refills | Status: DC | PRN
Start: 2022-11-21 — End: 2023-04-04

## 2022-11-21 NOTE — Addendum Note (Signed)
Addended by: Edd Arbour on: 11/21/2022 11:59 AM   Modules accepted: Orders

## 2022-11-22 ENCOUNTER — Other Ambulatory Visit: Payer: Self-pay

## 2022-11-22 DIAGNOSIS — Z86718 Personal history of other venous thrombosis and embolism: Secondary | ICD-10-CM

## 2022-11-22 MED ORDER — RIVAROXABAN 20 MG PO TABS
20.0000 mg | ORAL_TABLET | Freq: Every day | ORAL | 11 refills | Status: DC
Start: 2022-11-22 — End: 2023-04-05

## 2022-11-22 NOTE — Telephone Encounter (Signed)
Pt called to request a refill on Xarelto.  Reviewed pt's chart, spoke to [facility], [place], who advised that pt should remain on Xarelto until her family is complete. Returned call, two identifiers used. Pt stated that her OB/GYN sent in a prescription. Informed her that it was only a month supply and I would send in refills for a year, so she wouldn't worry about running out. Confirmed understanding. Gave my condolences, pt appreciative.

## 2023-04-04 ENCOUNTER — Ambulatory Visit (HOSPITAL_COMMUNITY)
Admission: EM | Admit: 2023-04-04 | Discharge: 2023-04-04 | Disposition: A | Discharge status: Home / Self Care | Attending: Emergency Medicine | Admitting: Emergency Medicine

## 2023-04-04 ENCOUNTER — Encounter (HOSPITAL_COMMUNITY): Payer: Self-pay

## 2023-04-04 DIAGNOSIS — J069 Acute upper respiratory infection, unspecified: Secondary | ICD-10-CM

## 2023-04-04 DIAGNOSIS — R112 Nausea with vomiting, unspecified: Secondary | ICD-10-CM | POA: Diagnosis not present

## 2023-04-04 LAB — POC COVID19/FLU A&B COMBO
Covid Antigen, POC: NEGATIVE
Influenza A Antigen, POC: NEGATIVE
Influenza B Antigen, POC: NEGATIVE

## 2023-04-04 LAB — POCT URINE PREGNANCY: Preg Test, Ur: NEGATIVE

## 2023-04-04 MED ORDER — PROMETHAZINE-DM 6.25-15 MG/5ML PO SYRP
5.0000 mL | ORAL_SOLUTION | Freq: Two times a day (BID) | ORAL | 0 refills | Status: DC | PRN
Start: 2023-04-04 — End: 2023-05-26

## 2023-04-04 MED ORDER — ONDANSETRON 4 MG PO TBDP
4.0000 mg | ORAL_TABLET | Freq: Once | ORAL | Status: AC
  Administered 2023-04-04: 4 mg via ORAL

## 2023-04-04 MED ORDER — ONDANSETRON 4 MG PO TBDP: 4.0000 mg | ORAL_TABLET | Freq: Three times a day (TID) | ORAL | 0 refills | Status: DC | PRN

## 2023-04-04 MED ORDER — ONDANSETRON 4 MG PO TBDP
ORAL_TABLET | ORAL | Status: AC
  Filled 2023-04-04: qty 1

## 2023-04-04 NOTE — ED Triage Notes (Signed)
 Pt c/o cough for past few days, vomiting since yesterday and had a faint line positive home pregnancy test.

## 2023-04-04 NOTE — ED Provider Notes (Signed)
 [facility]    CSN: [id number] Arrival date & time: 04/04/23  1149      History   Chief Complaint Chief Complaint  Patient presents with   Cough   Emesis    HPI Nicole Hodge is a 23 y.o. female.   Patient presents today with a several day history of cough.  Reports over the past 24 hours she has felt significant nausea and vomiting to the point that she has had decreased oral intake.  She thought she could be pregnant even though LMP 03/15/2023 and thought she had a faint positive on that test but is unsure.  She denies any known sick contacts.  She has not been taking any over-the-counter medication for symptom management.  She has not been eating and drinking as a result of the abdominal discomfort.  Denies any recent antibiotics or steroids.  She denies any history of asthma, allergies, smoking.  She has not had COVID in the past.    Past Medical History:  Diagnosis Date   DVT (deep venous thrombosis) [facility])     Patient Active Problem List   Diagnosis Date Noted   History of DVT (deep vein thrombosis) 10/24/2022   Iron deficiency anemia due to chronic blood loss 05/08/2019    Past Surgical History:  Procedure Laterality Date   IVC VENOGRAPHY N/A 06/06/2018   Procedure: IVC Venography;  Surgeon: Maeola Harman, MD;  Location: [facility] INVASIVE CV LAB;  Service: Cardiovascular;  Laterality: N/A;   PERIPHERAL VASCULAR THROMBECTOMY N/A 06/06/2018   Procedure: PERIPHERAL VASCULAR THROMBECTOMY;  Surgeon: Maeola Harman, MD;  Location: [facility] INVASIVE CV LAB;  Service: Cardiovascular;  Laterality: N/A;    OB History     Gravida  2   Para  0   Term  0   Preterm  0   AB  1   Living  0      SAB  1   IAB  0   Ectopic  0   Multiple  0   Live Births  0            Home Medications    Prior to Admission medications   Medication Sig Start Date End Date Taking? Authorizing Provider  ondansetron (ZOFRAN-ODT) 4 MG disintegrating tablet  Take 1 tablet (4 mg total) by mouth every 8 (eight) hours as needed. 04/04/23  Yes Kyung Muto K, PA-C  promethazine-dextromethorphan (PROMETHAZINE-DM) 6.25-15 MG/5ML syrup Take 5 mLs by mouth 2 (two) times daily as needed for cough. 04/04/23  Yes Maigan Bittinger, Noberto Retort, PA-C  rivaroxaban (XARELTO) 20 MG TABS tablet Take 1 tablet (20 mg total) by mouth daily with supper. Patient not taking: Reported on 04/04/2023 11/22/22   Cephus Shelling, MD    Family History Family History  Problem Relation Age of Onset   Other Mother        unknown cause of deather, age 63   Deep vein thrombosis Mother    Healthy Father    Cerebral palsy Sister    Healthy Brother     Social History Social History   Tobacco Use   Smoking status: Never   Smokeless tobacco: Never   Tobacco comments:    Quit vaping 2023  Vaping Use   Vaping status: Former  Substance Use Topics   Alcohol use: Not Currently    Comment: quit with preg   Drug use: Not Currently    Types: Marijuana    Comment: stopped with preg  Allergies   Patient has no known allergies.   Review of Systems Review of Systems  Constitutional:  Positive for activity change. Negative for appetite change, fatigue and fever.  HENT:  Positive for congestion. Negative for sinus pressure, sneezing and sore throat.   Respiratory:  Positive for cough. Negative for shortness of breath.   Cardiovascular:  Negative for chest pain.  Gastrointestinal:  Positive for nausea and vomiting. Negative for abdominal pain and diarrhea.  Neurological:  Positive for headaches. Negative for dizziness and light-headedness.     Physical Exam Triage Vital Signs ED Triage Vitals  Encounter Vitals Group     BP 04/04/23 1243 118/69     Systolic BP Percentile --      Diastolic BP Percentile --      Pulse Rate 04/04/23 1243 (!) 58     Resp 04/04/23 1243 18     Temp 04/04/23 1243 98.8 F (37.1 C)     Temp Source 04/04/23 1243 Oral     SpO2 04/04/23 1243 98 %      Weight --      Height --      Head Circumference --      Peak Flow --      Pain Score 04/04/23 1244 0     Pain Loc --      Pain Education --      Exclude from Growth Chart --    No data found.  Updated Vital Signs BP 118/69 (BP Location: Left Arm)   Pulse (!) 58   Temp 98.8 F (37.1 C) (Oral)   Resp 18   LMP 03/15/2023 (Exact Date) Comment: bled one day in Aug  SpO2 98%   Breastfeeding No   Visual Acuity Right Eye Distance:   Left Eye Distance:   Bilateral Distance:    Right Eye Near:   Left Eye Near:    Bilateral Near:     Physical Exam Vitals reviewed.  Constitutional:      General: She is awake. She is not in acute distress.    Appearance: Normal appearance. She is well-developed. She is not ill-appearing.     Comments: Very pleasant female appears stated age in no acute distress sitting comfortably in exam room  HENT:     Head: Normocephalic and atraumatic.     Right Ear: Tympanic membrane, ear canal and external ear normal. Tympanic membrane is not erythematous or bulging.     Left Ear: Tympanic membrane, ear canal and external ear normal. Tympanic membrane is not erythematous or bulging.     Nose:     Right Sinus: No maxillary sinus tenderness or frontal sinus tenderness.     Left Sinus: No maxillary sinus tenderness or frontal sinus tenderness.     Mouth/Throat:     Pharynx: Uvula midline. Posterior oropharyngeal erythema present. No oropharyngeal exudate.  Cardiovascular:     Rate and Rhythm: Normal rate and regular rhythm.     Heart sounds: Normal heart sounds, S1 normal and S2 normal. No murmur heard. Pulmonary:     Effort: Pulmonary effort is normal.     Breath sounds: Normal breath sounds. No wheezing, rhonchi or rales.     Comments: Clear to auscultation bilaterally Abdominal:     General: Bowel sounds are normal.     Palpations: Abdomen is soft.     Tenderness: There is no abdominal tenderness. There is no right CVA tenderness, left CVA tenderness,  guarding or rebound.  Psychiatric:  Behavior: Behavior is cooperative.      UC Treatments / Results  Labs (all labs ordered are listed, but only abnormal results are displayed) Labs Reviewed  POCT URINE PREGNANCY  POC COVID19/FLU A&B COMBO    EKG   Radiology No results found.  Procedures Procedures (including critical care time)  Medications Ordered in UC Medications  ondansetron (ZOFRAN-ODT) disintegrating tablet 4 mg (4 mg Oral Given 04/04/23 1350)    Initial Impression / Assessment and Plan / UC Course  I have reviewed the triage vital signs and the nursing notes.  Pertinent labs & imaging results that were available during my care of the patient were reviewed by me and considered in my medical decision making (see chart for details).     Patient is well-appearing, afebrile, nontoxic, nontachycardic.  No evidence of acute infection on physical exam that warrant initiation of antibiotics.  She was given Zofran in clinic and this provided some relief of symptoms.  She was able to pass oral challenge without difficulty.  Zofran was sent to pharmacy to be used every 8 hours as needed.  She was also given Promethazine DM for cough.  We discussed that this can be sedating and she is not to drive or drink alcohol while taking it.  She does negative for influenza and COVID in clinic.  We discussed that she likely has a different virus.  She is to use over-the-counter medication including Mucinex, Flonase, Tylenol.  She is to rest and drink plenty of fluid.  We discussed that if her symptoms are not improving within a few days.  If anything worsens and she has high fever, worsening cough, chest pain, shortness of breath she needs to be seen emergently.  Strict return precautions given.  Excuse note provided.  All questions answered to patient satisfaction.   Final Clinical Impressions(s) / UC Diagnoses   Final diagnoses:  Viral URI with cough  Nausea and vomiting, unspecified  vomiting type     Discharge Instructions      You tested negative for flu and COVID here.  You also had a negative pregnancy test.  Suspect you have a viral illness.  Use Zofran every 8 hours as needed.  Eat a bland diet and drink plenty of fluid.  Avoid spicy/acidic/fatty foods.  To Promethazine DM for cough.  This will make you sleepy so do not drive or drink alcohol with taking it.  You can use over-the-counter medication including Tylenol, Mucinex, Flonase, nasal saline/sinus rinses.  Make sure you rest and drink plenty of fluid.  If anything worsens and you have severe cough, chest pain, shortness of breath, nausea/vomiting despite the medication, weakness you need to be seen immediately.     ED Prescriptions     Medication Sig Dispense Auth. Provider   ondansetron (ZOFRAN-ODT) 4 MG disintegrating tablet Take 1 tablet (4 mg total) by mouth every 8 (eight) hours as needed. 20 tablet Marquet Faircloth K, PA-C   promethazine-dextromethorphan (PROMETHAZINE-DM) 6.25-15 MG/5ML syrup Take 5 mLs by mouth 2 (two) times daily as needed for cough. 118 mL Alec Jaros K, PA-C      PDMP not reviewed this encounter.   Jeani Hawking, PA-C 04/04/23 1458

## 2023-04-04 NOTE — Discharge Instructions (Addendum)
 You tested negative for flu and COVID here.  You also had a negative pregnancy test.  Suspect you have a viral illness.  Use Zofran every 8 hours as needed.  Eat a bland diet and drink plenty of fluid.  Avoid spicy/acidic/fatty foods.  To Promethazine DM for cough.  This will make you sleepy so do not drive or drink alcohol with taking it.  You can use over-the-counter medication including Tylenol, Mucinex, Flonase, nasal saline/sinus rinses.  Make sure you rest and drink plenty of fluid.  If anything worsens and you have severe cough, chest pain, shortness of breath, nausea/vomiting despite the medication, weakness you need to be seen immediately.

## 2023-04-05 ENCOUNTER — Other Ambulatory Visit: Payer: Self-pay

## 2023-04-05 DIAGNOSIS — Z86718 Personal history of other venous thrombosis and embolism: Secondary | ICD-10-CM

## 2023-04-05 MED ORDER — RIVAROXABAN 20 MG PO TABS: 20.0000 mg | ORAL_TABLET | Freq: Every day | ORAL | 11 refills | Status: AC

## 2023-05-26 ENCOUNTER — Encounter (HOSPITAL_COMMUNITY): Payer: Self-pay

## 2023-05-26 ENCOUNTER — Ambulatory Visit (HOSPITAL_COMMUNITY)
Admission: EM | Admit: 2023-05-26 | Discharge: 2023-05-26 | Disposition: A | Discharge status: Home / Self Care | Attending: Emergency Medicine | Admitting: Emergency Medicine

## 2023-05-26 DIAGNOSIS — H6123 Impacted cerumen, bilateral: Secondary | ICD-10-CM

## 2023-05-26 DIAGNOSIS — H9201 Otalgia, right ear: Secondary | ICD-10-CM

## 2023-05-26 MED ORDER — CARBAMIDE PEROXIDE 6.5 % OT SOLN: 5.0000 [drp] | Freq: Two times a day (BID) | OTIC | 0 refills | Status: DC

## 2023-05-26 NOTE — ED Triage Notes (Signed)
 Pt c/o left ear pain,chills and fatigue x 2 days.

## 2023-05-26 NOTE — Discharge Instructions (Signed)
 Apply Debrox eardrops twice daily to right ear to help break up earwax. Return here if symptoms persist or worsen.

## 2023-05-26 NOTE — ED Provider Notes (Signed)
 [facility]    CSN: [id number] Arrival date & time: 05/26/23  1459      History   Chief Complaint No chief complaint on file.   HPI Nicole Hodge is a 23 y.o. female.   Patient presents with right-sided ear pain described as throbbing and pounding x 2 days.  Patient states that she has difficulty sleeping due to pain.  Patient denies taking any medications for pain.  Denies cough, congestion, sore throat, and fever.  The history is provided by the patient and medical records.    Past Medical History:  Diagnosis Date   DVT (deep venous thrombosis) [facility])     Patient Active Problem List   Diagnosis Date Noted   History of DVT (deep vein thrombosis) 10/24/2022   Iron deficiency anemia due to chronic blood loss 05/08/2019    Past Surgical History:  Procedure Laterality Date   IVC VENOGRAPHY N/A 06/06/2018   Procedure: IVC Venography;  Surgeon: Adine Hoof, MD;  Location: [facility] INVASIVE CV LAB;  Service: Cardiovascular;  Laterality: N/A;   PERIPHERAL VASCULAR THROMBECTOMY N/A 06/06/2018   Procedure: PERIPHERAL VASCULAR THROMBECTOMY;  Surgeon: Adine Hoof, MD;  Location: [facility] INVASIVE CV LAB;  Service: Cardiovascular;  Laterality: N/A;    OB History     Gravida  2   Para  0   Term  0   Preterm  0   AB  1   Living  0      SAB  1   IAB  0   Ectopic  0   Multiple  0   Live Births  0            Home Medications    Prior to Admission medications   Medication Sig Start Date End Date Taking? Authorizing Provider  carbamide peroxide (DEBROX) 6.5 % OTIC solution Place 5 drops into the right ear 2 (two) times daily. 05/26/23  Yes Rosevelt Constable, Debbe Crumble A, NP  rivaroxaban  (XARELTO ) 20 MG TABS tablet Take 1 tablet (20 mg total) by mouth daily with supper. 04/05/23  Yes Young Hensen, MD  ondansetron  (ZOFRAN -ODT) 4 MG disintegrating tablet Take 1 tablet (4 mg total) by mouth every 8 (eight) hours as needed. 04/04/23   Raspet,  Betsey Brow, PA-C    Family History Family History  Problem Relation Age of Onset   Other Mother        unknown cause of deather, age 30   Deep vein thrombosis Mother    Healthy Father    Cerebral palsy Sister    Healthy Brother     Social History Social History   Tobacco Use   Smoking status: Never   Smokeless tobacco: Never   Tobacco comments:    Quit vaping 2023  Vaping Use   Vaping status: Former  Substance Use Topics   Alcohol use: Not Currently    Comment: quit with preg   Drug use: Not Currently    Types: Marijuana    Comment: stopped with preg     Allergies   Patient has no known allergies.   Review of Systems Review of Systems  Per HPI  Physical Exam Triage Vital Signs ED Triage Vitals [05/26/23 1558]  Encounter Vitals Group     BP 118/70     Systolic BP Percentile      Diastolic BP Percentile      Pulse Rate 78     Resp 16     Temp 98.2 F (36.8 C)  Temp Source Oral     SpO2 99 %     Weight      Height      Head Circumference      Peak Flow      Pain Score      Pain Loc      Pain Education      Exclude from Growth Chart    No data found.  Updated Vital Signs BP 118/70 (BP Location: Left Arm)   Pulse 78   Temp 98.2 F (36.8 C) (Oral)   Resp 16   LMP  (LMP Unknown) Comment: bled one day in Aug  SpO2 99%   Visual Acuity Right Eye Distance:   Left Eye Distance:   Bilateral Distance:    Right Eye Near:   Left Eye Near:    Bilateral Near:     Physical Exam Vitals and nursing note reviewed.  Constitutional:      General: She is awake. She is not in acute distress.    Appearance: Normal appearance. She is well-developed and well-groomed. She is not ill-appearing.  HENT:     Right Ear: External ear normal. There is impacted cerumen.     Left Ear: External ear normal. There is impacted cerumen.     Nose: Nose normal.     Mouth/Throat:     Mouth: Mucous membranes are moist.     Pharynx: Oropharynx is clear.  Skin:     General: Skin is warm and dry.  Neurological:     Mental Status: She is alert.  Psychiatric:        Behavior: Behavior is cooperative.      UC Treatments / Results  Labs (all labs ordered are listed, but only abnormal results are displayed) Labs Reviewed - No data to display  EKG   Radiology No results found.  Procedures Procedures (including critical care time)  Medications Ordered in UC Medications - No data to display  Initial Impression / Assessment and Plan / UC Course  I have reviewed the triage vital signs and the nursing notes.  Pertinent labs & imaging results that were available during my care of the patient were reviewed by me and considered in my medical decision making (see chart for details).     Patient is well-appearing.  Vitals are stable.  Upon assessment bilateral impacted cerumen noted.  Reassessment after ear lavage revealed persistent impacted cerumen to right ear.  Prescribed Debrox eardrops.  Discussed return precautions. Final Clinical Impressions(s) / UC Diagnoses   Final diagnoses:  Right ear pain  Bilateral impacted cerumen     Discharge Instructions      Apply Debrox eardrops twice daily to right ear to help break up earwax. Return here if symptoms persist or worsen.     ED Prescriptions     Medication Sig Dispense Auth. Provider   carbamide peroxide (DEBROX) 6.5 % OTIC solution Place 5 drops into the right ear 2 (two) times daily. 15 mL Levora Reas A, NP      PDMP not reviewed this encounter.   Levora Reas A, NP 05/26/23 1655

## 2023-08-16 ENCOUNTER — Encounter (HOSPITAL_COMMUNITY): Payer: Self-pay

## 2023-08-16 ENCOUNTER — Ambulatory Visit (HOSPITAL_COMMUNITY)
Admission: EM | Admit: 2023-08-16 | Discharge: 2023-08-16 | Disposition: A | Discharge status: Home / Self Care | Payer: Self-pay | Attending: Family Medicine | Admitting: Family Medicine

## 2023-08-16 DIAGNOSIS — M62830 Muscle spasm of back: Secondary | ICD-10-CM

## 2023-08-16 MED ORDER — CYCLOBENZAPRINE HCL 10 MG PO TABS: ORAL_TABLET | ORAL | 0 refills | Status: DC
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
## 2023-09-06 ENCOUNTER — Other Ambulatory Visit: Payer: Self-pay

## 2023-09-06 ENCOUNTER — Encounter (HOSPITAL_COMMUNITY): Payer: Self-pay | Admitting: *Deleted

## 2023-09-06 ENCOUNTER — Ambulatory Visit (HOSPITAL_COMMUNITY)
Admission: EM | Admit: 2023-09-06 | Discharge: 2023-09-06 | Disposition: A | Discharge status: Home / Self Care | Attending: Family Medicine | Admitting: Family Medicine

## 2023-09-06 DIAGNOSIS — K529 Noninfective gastroenteritis and colitis, unspecified: Secondary | ICD-10-CM | POA: Diagnosis not present

## 2023-09-06 MED ORDER — ONDANSETRON 4 MG PO TBDP
4.0000 mg | ORAL_TABLET | Freq: Once | ORAL | Status: AC
  Administered 2023-09-06: 4 mg via ORAL

## 2023-09-06 MED ORDER — ONDANSETRON 4 MG PO TBDP
ORAL_TABLET | ORAL | Status: AC
  Filled 2023-09-06: qty 1

## 2023-09-06 MED ORDER — ONDANSETRON 4 MG PO TBDP: 4.0000 mg | ORAL_TABLET | Freq: Three times a day (TID) | ORAL | 0 refills | Status: AC | PRN

## 2023-09-06 NOTE — Discharge Instructions (Signed)
 Ondansetron  dissolved in the mouth every 8 hours as needed for nausea or vomiting. Clear liquids(water, gatorade/pedialyte, ginger ale/sprite, chicken broth/soup) and bland things(crackers/toast, rice, potato, bananas) to eat. Avoid acidic foods like lemon/lime/orange/tomato, and avoid greasy/spicy foods.  Gave you 1 dose of this medication here in the clinic.

## 2023-09-06 NOTE — ED Notes (Signed)
 Reviewed work note

## 2023-09-06 NOTE — ED Provider Notes (Signed)
 [facility]    CSN: [id number] Arrival date & time: 09/06/23  1504      History   Chief Complaint Chief Complaint  Patient presents with   Emesis    HPI Nicole Hodge is a 23 y.o. female.    Emesis Here for nausea and vomiting and diarrhea.  Symptoms began this morning.  She has had about 4 episodes of emesis and 2 of diarrhea today.  No blood in any output.  No fever or chills or respiratory symptoms.  Last menstrual cycle was July 14  NKDA Past Medical History:  Diagnosis Date   DVT (deep venous thrombosis) [facility])     Patient Active Problem List   Diagnosis Date Noted   History of DVT (deep vein thrombosis) 10/24/2022   Iron deficiency anemia due to chronic blood loss 05/08/2019    Past Surgical History:  Procedure Laterality Date   IVC VENOGRAPHY N/A 06/06/2018   Procedure: IVC Venography;  Surgeon: Sheree Penne Bruckner, MD;  Location: [facility] INVASIVE CV LAB;  Service: Cardiovascular;  Laterality: N/A;   PERIPHERAL VASCULAR THROMBECTOMY N/A 06/06/2018   Procedure: PERIPHERAL VASCULAR THROMBECTOMY;  Surgeon: Sheree Penne Bruckner, MD;  Location: [facility] INVASIVE CV LAB;  Service: Cardiovascular;  Laterality: N/A;    OB History     Gravida  2   Para  0   Term  0   Preterm  0   AB  1   Living  0      SAB  1   IAB  0   Ectopic  0   Multiple  0   Live Births  0            Home Medications    Prior to Admission medications   Medication Sig Start Date End Date Taking? Authorizing Provider  ondansetron  (ZOFRAN -ODT) 4 MG disintegrating tablet Take 1 tablet (4 mg total) by mouth every 8 (eight) hours as needed for nausea or vomiting. 09/06/23  Yes Vonna Sharlet POUR, MD  rivaroxaban  (XARELTO ) 20 MG TABS tablet Take 1 tablet (20 mg total) by mouth daily with supper. 04/05/23  Yes Gretta Bruckner PARAS, MD    Family History Family History  Problem Relation Age of Onset   Other Mother        unknown cause of deather, age 55   Deep vein  thrombosis Mother    Healthy Father    Cerebral palsy Sister    Healthy Brother     Social History Social History   Tobacco Use   Smoking status: Never   Smokeless tobacco: Never   Tobacco comments:    Quit vaping 2023  Vaping Use   Vaping status: Former  Substance Use Topics   Alcohol use: Not Currently    Comment: quit with preg   Drug use: Not Currently    Types: Marijuana    Comment: stopped with preg     Allergies   Patient has no known allergies.   Review of Systems Review of Systems  Gastrointestinal:  Positive for vomiting.     Physical Exam Triage Vital Signs ED Triage Vitals  Encounter Vitals Group     BP 09/06/23 1526 113/75     Girls Systolic BP Percentile --      Girls Diastolic BP Percentile --      Boys Systolic BP Percentile --      Boys Diastolic BP Percentile --      Pulse Rate 09/06/23 1526 65  Resp 09/06/23 1526 16     Temp 09/06/23 1526 98.1 F (36.7 C)     Temp src --      SpO2 09/06/23 1526 98 %     Weight --      Height --      Head Circumference --      Peak Flow --      Pain Score 09/06/23 1523 7     Pain Loc --      Pain Education --      Exclude from Growth Chart --    No data found.  Updated Vital Signs BP 113/75   Pulse 65   Temp 98.1 F (36.7 C)   Resp 16   LMP 08/13/2023 (Exact Date)   SpO2 98%   Visual Acuity Right Eye Distance:   Left Eye Distance:   Bilateral Distance:    Right Eye Near:   Left Eye Near:    Bilateral Near:     Physical Exam Vitals reviewed.  Constitutional:      General: She is not in acute distress.    Appearance: She is not ill-appearing, toxic-appearing or diaphoretic.  HENT:     Mouth/Throat:     Mouth: Mucous membranes are moist.  Eyes:     Extraocular Movements: Extraocular movements intact.     Conjunctiva/sclera: Conjunctivae normal.     Pupils: Pupils are equal, round, and reactive to light.  Cardiovascular:     Rate and Rhythm: Normal rate and regular rhythm.      Heart sounds: No murmur heard. Pulmonary:     Effort: Pulmonary effort is normal.     Breath sounds: Normal breath sounds.  Abdominal:     General: There is no distension.     Palpations: Abdomen is soft.     Comments: Mild tenderness is noted in all quadrants.  Musculoskeletal:     Cervical back: Neck supple.  Lymphadenopathy:     Cervical: No cervical adenopathy.  Skin:    Coloration: Skin is not jaundiced or pale.  Neurological:     General: No focal deficit present.     Mental Status: She is alert and oriented to person, place, and time.  Psychiatric:        Behavior: Behavior normal.      UC Treatments / Results  Labs (all labs ordered are listed, but only abnormal results are displayed) Labs Reviewed - No data to display  EKG   Radiology No results found.  Procedures Procedures (including critical care time)  Medications Ordered in UC Medications  ondansetron  (ZOFRAN -ODT) disintegrating tablet 4 mg (has no administration in time range)    Initial Impression / Assessment and Plan / UC Course  I have reviewed the triage vital signs and the nursing notes.  Pertinent labs & imaging results that were available during my care of the patient were reviewed by me and considered in my medical decision making (see chart for details).     Zofran  is given here and Zofran  is the pharmacy.  Rest clear liquids and a bland diet.   Final Clinical Impressions(s) / UC Diagnoses   Final diagnoses:  Gastroenteritis     Discharge Instructions      Ondansetron  dissolved in the mouth every 8 hours as needed for nausea or vomiting. Clear liquids(water, gatorade/pedialyte, ginger ale/sprite, chicken broth/soup) and bland things(crackers/toast, rice, potato, bananas) to eat. Avoid acidic foods like lemon/lime/orange/tomato, and avoid greasy/spicy foods.  Gave you 1 dose of this medication here  in the clinic.     ED Prescriptions     Medication Sig Dispense Auth.  Provider   ondansetron  (ZOFRAN -ODT) 4 MG disintegrating tablet Take 1 tablet (4 mg total) by mouth every 8 (eight) hours as needed for nausea or vomiting. 10 tablet Vonna Rhiannan Kievit K, MD      PDMP not reviewed this encounter.   Vonna Sharlet POUR, MD 09/06/23 [phone]

## 2023-09-06 NOTE — ED Triage Notes (Signed)
 Pt reports she has been vomiting today . 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 Pt tried to eat some crackers but was unable to.Pt  has not taken any OTC .

## 2023-09-12 ENCOUNTER — Other Ambulatory Visit: Payer: Self-pay

## 2023-09-23 DIAGNOSIS — Z20822 Contact with and (suspected) exposure to covid-19: Secondary | ICD-10-CM | POA: Diagnosis not present

## 2023-09-23 DIAGNOSIS — R0981 Nasal congestion: Secondary | ICD-10-CM | POA: Diagnosis not present

## 2023-09-23 DIAGNOSIS — J069 Acute upper respiratory infection, unspecified: Secondary | ICD-10-CM | POA: Diagnosis not present

## 2023-12-18 ENCOUNTER — Telehealth: Payer: Self-pay

## 2023-12-18 NOTE — Telephone Encounter (Signed)
 The patient called with a question regarding Xarelto  and fertility. The patient was advised to consult her gynecologist with this question.  She had no further questions.

## 2024-02-16 DIAGNOSIS — F321 Major depressive disorder, single episode, moderate: Secondary | ICD-10-CM | POA: Insufficient documentation

## 2024-02-16 DIAGNOSIS — Z59869 Financial insecurity, unspecified: Secondary | ICD-10-CM | POA: Insufficient documentation
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
## 2024-02-17 ENCOUNTER — Ambulatory Visit (HOSPITAL_COMMUNITY)
Admission: EM | Admit: 2024-02-17 | Discharge: 2024-02-17 | Disposition: A | Discharge status: Home / Self Care | Attending: Nurse Practitioner | Admitting: Nurse Practitioner

## 2024-02-17 DIAGNOSIS — F321 Major depressive disorder, single episode, moderate: Secondary | ICD-10-CM

## 2024-02-17 NOTE — ED Provider Notes (Signed)
 Behavioral Health Urgent Care Medical Screening Exam  Patient Name: Nicole Hodge MRN: [id number] Date of Evaluation: 02/17/24 Chief Complaint:  help with my mental health Diagnosis:  Final diagnoses:  Major depressive disorder, single episode, moderate (HCC)    History of Present illness: Nicole Hodge is a 24 y.o. female patient presented to [facility] as a walk in unaccompanied with complaints of depression symptom and passive suicidal ideations with no plan or intent.   Nicole Hodge, 24 y.o., female patient seen face to face by this provider and chart reviewed on 02/17/24.  On evaluation Nicole Hodge reports that her mother dies a year ago and her sister's father recentt\ly passed away and the funeral is tomorrow.  Patient reports stress at home work, financial stressors, feeling overwhelmed and having a limited support system.  Patient reports that her brother is a support for her.  Patient endorses symptoms of increased anxiety, irritability, anhedonia, anxiety symptoms racing thoughts, self-isolation patient endorses smoking marijuana daily and states that it helps her calm down and helps her sleep at night.  Patient states that she was having some suicidal ideations with no plan or intent prior to arrival to [facility], but no longer having any suicidal ideations at this time.  During evaluation Nicole Hodge is sitting in the assessment room in no acute distress.  She is alert, oriented x 4, calm, cooperative and attentive. Her mood is depressed with congruent affect.  She has normal speech, and behavior.  Objectively there is no evidence of psychosis/mania or delusional thinking.  Patient is able to converse coherently, goal directed thoughts, no distractibility, or pre-occupation.  She also denies suicidal/self-harm/homicidal ideation, psychosis, and paranoia.  Patient answered questions appropriately.  Patient denies any previous inpatient psychiatric history or  medications.  Patient is able to contract for safety and wants to community resources for medication management and outpatient therapy. Patient can be discharged with community resources and can follow up with outpatient services for Medication Management and Individual Therapy   Flowsheet Row ED from 02/17/2024 in [facility] from 09/06/2023 in [facility] from 08/16/2023 in [facility] RISK CATEGORY High Risk No Risk No Risk    Psychiatric Specialty Exam  Presentation  General Appearance:Casual  Eye Contact:Good  Speech:Clear and Coherent  Speech Volume:Normal  Handedness:Right   Mood and Affect  Mood: Depressed  Affect: Appropriate   Thought Process  Thought Processes: Coherent  Descriptions of Associations:Intact  Orientation:Full (Time, Place and Person)  Thought Content:WDL    Hallucinations:None  Ideas of Reference:None  Suicidal Thoughts:No  Homicidal Thoughts:No   Sensorium  Memory: Immediate Fair; Recent Fair; Remote Fair  Judgment: Fair  Insight: Fair   Art Therapist  Concentration: Fair  Attention Span: Fair  Recall: [employer] of Knowledge: Fair  Language: Fair   Psychomotor Activity  Psychomotor Activity: Normal   Assets  Assets: Housing; Manufacturing Systems Engineer; Desire for Improvement   Sleep  Sleep: Fair  Number of hours: No data recorded  Physical Exam: Physical Exam HENT:     Head: Normocephalic.     Nose: Nose normal.  Eyes:     Pupils: Pupils are equal, round, and reactive to light.  Cardiovascular:     Rate and Rhythm: Normal rate.  Abdominal:     General: Abdomen is flat.  Musculoskeletal:        General: Normal range of motion.  Cervical back: Normal range of motion.  Skin:    General: Skin is warm.  Neurological:     Mental Status: She is alert and oriented to person, place, and time.   Psychiatric:        Attention and Perception: Attention normal.        Mood and Affect: Mood is depressed.        Speech: Speech normal.        Behavior: Behavior is cooperative.        Thought Content: Thought content is not paranoid or delusional. Thought content does not include homicidal or suicidal ideation. Thought content does not include homicidal or suicidal plan.        Cognition and Memory: Cognition normal.        Judgment: Judgment normal.    Review of Systems  Constitutional: Negative.   HENT: Negative.    Eyes: Negative.   Respiratory: Negative.    Cardiovascular: Negative.   Gastrointestinal: Negative.   Musculoskeletal: Negative.   Skin: Negative.   Neurological: Negative.   Psychiatric/Behavioral:  Positive for depression.    Blood pressure (!) 142/87, pulse 83, temperature 98.5 F (36.9 C), temperature source Oral, resp. rate 14, SpO2 100%. There is no height or weight on file to calculate BMI.  Musculoskeletal: Strength & Muscle Tone: within normal limits Gait & Station: normal Patient leans: N/A   [facility] MSE Discharge Disposition for Follow up and Recommendations: Based on my evaluation the patient does not appear to have an emergency medical condition and can be discharged with resources and follow up care in outpatient services for Medication Management and Individual Therapy   Tome Nickelson E Melvinia Ashby, NP 02/17/2024, 2:30 AM

## 2024-02-17 NOTE — Discharge Instructions (Addendum)
" °  Discharge recommendations:  Patient is to take medications as prescribed. Please see information for follow-up appointment with psychiatry and therapy. Please follow up with your primary care provider for all medical related needs.   Therapy: We recommend that patient participate in individual therapy to address mental health concerns.  Medications: The patient or guardian is to contact a medical professional and/or outpatient provider to address any new side effects that develop. The patient or guardian should update outpatient providers of any new medications and/or medication changes.   Atypical antipsychotics: If you are prescribed an atypical antipsychotic, it is recommended that your height, weight, BMI, blood pressure, fasting lipid panel, and fasting blood sugar be monitored by your outpatient providers.  Safety:  The patient should abstain from use of illicit substances/drugs and abuse of any medications. If symptoms worsen or do not continue to improve or if the patient becomes actively suicidal or homicidal then it is recommended that the patient return to the closest hospital emergency department, the [facility], or call 911 for further evaluation and treatment. National Suicide Prevention Lifeline 1-800-SUICIDE or 573 558 3078.  About 988 988 offers 24/7 access to trained crisis counselors who can help people experiencing mental health-related distress. People can call or text 988 or chat 988lifeline.org for themselves or if they are worried about a loved one who may need crisis support.  Crisis Mobile: [facility]:                     580-558-0813 (for crisis response 24 hours a day) [facility] Hotline:                                            442 425 5792   [facility] [address],  [place], [place], [address] 336 [phone]  [facility] [address] [place], [place] [address] [phone]  [facility]  [address],  [place], [place] [address] [phone]  [facility] [address] [place], [place] [address]  [phone]   [facility] [address] [place] [place], [place] [address]  [facility] [address] [place], [place] [address] [phone] "

## 2024-02-22 ENCOUNTER — Ambulatory Visit: Payer: Self-pay | Admitting: Psychology

## 2024-02-26 NOTE — Progress Notes (Signed)
 "  NEW GYNECOLOGY PATIENT Patient name: Nicole Hodge MRN [id number]  Date of birth: 10-23-2000 Chief Complaint:   New GYN   History:  Nicole Hodge here for preconception counseling. Last 2 pregnancy ended in SABs, with last one in 2024. Previously switched back to lovenox  when pregnant. Normal period in the beginning of January. Having milky discharge intermittently from nipples and it may shoot/leak out or will have discharge with warm shower. Has nipple piercings for more than a year but the discharge hasn't been for along, left more than right. Only on xarelto  and prenatal vitamins. No new headaches or vision changes. Last pregnancy test was 3 days ago and was negative. Typically if putting pressure on the breast or in the shower wil notice the discharge. White discharge, no blood, no pain when it occurs. No redness of the breast.      Gynecologic History No LMP recorded. Contraception: none   OB History  Gravida Para Term Preterm AB Living  2 0 0 0 1 0  SAB IAB Ectopic Multiple Live Births  1 0 0 0 0    # Outcome Date GA Lbr Len/2nd Weight Sex Type Anes PTL Lv  2 Gravida           1 SAB 05/2022     SAB        The following portions of the patient's history were reviewed and updated as appropriate: allergies, current medications, past family history, past medical history, past social history, past surgical history and problem list. Health Maintenance  Topic Date Due   Pap Smear  Never done   Chlamydia screening  06/13/2023   Flu Shot  08/31/2023   COVID-19 Vaccine (1 - 2025-26 season) Never done   DTaP/Tdap/Td vaccine (9 - Td or Tdap) 10/06/2031   Pneumococcal Vaccine  Completed   Hepatitis B Vaccine  Completed   HPV Vaccine  Completed   Hepatitis C Screening  Completed   HIV Screening  Completed   Meningitis B Vaccine  Aged Out     Review of Systems Pertinent items noted in HPI and remainder of comprehensive ROS otherwise negative.  Physical Exam:  There were  no vitals taken for this visit. Physical Exam Vitals and nursing note reviewed. Exam conducted with a chaperone present.  Constitutional:      Appearance: Normal appearance.  Pulmonary:     Effort: Pulmonary effort is normal.  Chest:     Comments: Bilateral piercing without erythema or masses Small milky discharged expressed on exam  Abdominal:     Palpations: Abdomen is soft.  Genitourinary:    General: Normal vulva.     Exam position: Lithotomy position.  Neurological:     Mental Status: She is alert.      Assessment and Plan:  1. Well woman exam with routine gynecological exam (Primary) - Cervical cancer screening: Discussed screening Q3 years. Reviewed importance of annual exams and limits of pap smear. Pap with reflex HPV collected - GC/CT: Discussed and recommended. Pt  accepts - Gardasil: completed - Birth Control: none - Breast Health: Encouraged self breast awareness/exams.  - Follow-up: 12 months and prn  - Cytology - PAP( [place]) - RPR+HBsAg+HCVAb+...  2. Nipple discharge Prolactin level to assess galactorrhea - suspect benign physiologic process - Prolactin  3. History of DVT (deep vein thrombosis) 4. Encounter for preconception consultation Baseline CBC and BMP and switch to lovenox  given desire for conception at this time. Recommend starting prenatal vitamins and avoiding  teratogenic substances. Early US  likely indicated given hx of SABs and known VTE hx. Recommend follow up with vascular providers who manage VTE. - Basic Metabolic Panel (BMET) - CBC - enoxaparin  (LOVENOX ) 40 MG/0.4ML injection; Inject 0.8 mLs (80 mg total) into the skin daily.  Dispense: 72 mL; Refill: 3     Follow-up: No follow-ups on file.      Carter Quarry, MD Obstetrician & Gynecologist, Faculty Practice [facility] [employer], [facility] "

## 2024-02-27 ENCOUNTER — Other Ambulatory Visit: Payer: Self-pay

## 2024-02-27 ENCOUNTER — Other Ambulatory Visit (HOSPITAL_COMMUNITY)
Admission: RE | Admit: 2024-02-27 | Discharge: 2024-02-27 | Disposition: A | Source: Ambulatory Visit | Attending: Family Medicine | Admitting: Family Medicine

## 2024-02-27 ENCOUNTER — Telehealth: Payer: Self-pay

## 2024-02-27 ENCOUNTER — Encounter: Payer: Self-pay | Admitting: Obstetrics and Gynecology

## 2024-02-27 ENCOUNTER — Ambulatory Visit: Admitting: Obstetrics and Gynecology

## 2024-02-27 VITALS — BP 122/66 | HR 72 | Wt 113.5 lb

## 2024-02-27 DIAGNOSIS — Z86718 Personal history of other venous thrombosis and embolism: Secondary | ICD-10-CM | POA: Diagnosis not present

## 2024-02-27 DIAGNOSIS — Z01419 Encounter for gynecological examination (general) (routine) without abnormal findings: Secondary | ICD-10-CM | POA: Diagnosis present

## 2024-02-27 DIAGNOSIS — Z3169 Encounter for other general counseling and advice on procreation: Secondary | ICD-10-CM

## 2024-02-27 DIAGNOSIS — N6452 Nipple discharge: Secondary | ICD-10-CM | POA: Diagnosis not present

## 2024-02-27 MED ORDER — ENOXAPARIN SODIUM 40 MG/0.4ML IJ SOSY: 80.0000 mg | PREFILLED_SYRINGE | INTRAMUSCULAR | 3 refills | Status: AC

## 2024-02-27 NOTE — Progress Notes (Unsigned)
 Client Name: Nicole Hodge Date: 02/22/2024 Duration: 60 min Clinician/Intern: Molly Grana, MSW Intern  Data: The client came into the office for an initial session. She reported feeling sad, tired, unable to sleep at night, not wanting to do anything, crying often, and experiencing a racing heart and body heat for more than a month. Client reported seeking urgent care due to worsening symptoms and was diagnosed with Major Depressive Disorder. Based on the clients report, she was assessed for depression and anxiety using the PHQ-9 and GAD-7. The client is a 24 year old female who is working and caring for her younger siblings. She reported that her mother passed away two years ago in 02-28-24, and her sisters father recently passed away. Since their passing, she has taken on the responsibility of caring for her younger siblings, as she is the oldest. These symptoms have impacted multiple areas of her life, including work, copywriter, advertising, and social functioning. The clients general appearance was appropriate for her age. She was cooperative throughout the interaction. Her affect was hyperthymic, flat, and sluggish. Her speech was slowed and soft. Her thought process was goal-oriented and logical. She demonstrated full awareness of the situation and was oriented 4. The client described feelings of hopelessness, which is consistent with major depression. Based on reported symptoms, the client was asked about suicidal ideation, plans, and access to means. She reported no plan or access at this time. Suicidal ideation and behavior will continue to be monitored. The client and intern developed a safety plan as a precaution. The session focused on the clients feelings, current stressors, building rapport, and developing a safety plan. The intern used active listening, validation, cognitive behavioral therapy techniques, and strengths identification.  Assessment: The clients symptoms are consistent with major  depressive disorder, including feelings of hopelessness, sadness, little pleasure in activities she used to do, fatigue, and restlessness. Symptoms appear related to the passing of her mother and her sisters father, both of whom she was close to. The client demonstrates good insight and motivation for therapy. 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 Challenges include limited coping skills and self-care. Current safety risks were assessed, and a safety plan was created with the client.  Plan: The intern provided reflective listening and introduced how sessions may move forward if the client chooses to continue. The client agreed to move forward and expressed a desire to receive help. The intern and client scheduled the next session, which will focus on exploring thoughts, feelings, and behaviors, and developing stress management strategies. The next session is scheduled for 02/29/2024.  Clinician Signature: Molly Grana, MSW Intern

## 2024-02-27 NOTE — Telephone Encounter (Signed)
 Patient called to notify [facility] that her OB/GYN is transitioning her from Xarelto  to Lovenox  because pt is trying to conceive.

## 2024-02-28 ENCOUNTER — Ambulatory Visit: Payer: Self-pay | Admitting: Obstetrics and Gynecology

## 2024-02-28 LAB — CYTOLOGY - PAP
Chlamydia: NEGATIVE
Comment: NEGATIVE
Comment: NORMAL
Diagnosis: NEGATIVE
Neisseria Gonorrhea: NEGATIVE

## 2024-02-28 LAB — RPR+HBSAG+HCVAB+...
HIV Screen 4th Generation wRfx: NONREACTIVE
Hep C Virus Ab: NONREACTIVE
Hepatitis B Surface Ag: NEGATIVE
RPR Ser Ql: NONREACTIVE

## 2024-02-28 LAB — BASIC METABOLIC PANEL WITH GFR
BUN/Creatinine Ratio: 13 (ref 9–23)
BUN: 11 mg/dL (ref 6–20)
CO2: 20 mmol/L (ref 20–29)
Calcium: 9.6 mg/dL (ref 8.7–10.2)
Chloride: 104 mmol/L (ref 96–106)
Creatinine, Ser: 0.84 mg/dL (ref 0.57–1.00)
Glucose: 84 mg/dL (ref 70–99)
Potassium: 4.7 mmol/L (ref 3.5–5.2)
Sodium: 140 mmol/L (ref 134–144)
eGFR: 99 mL/min/{1.73_m2}

## 2024-02-28 LAB — CBC
Hematocrit: 43.1 % (ref 34.0–46.6)
Hemoglobin: 14.4 g/dL (ref 11.1–15.9)
MCH: 28.9 pg (ref 26.6–33.0)
MCHC: 33.4 g/dL (ref 31.5–35.7)
MCV: 87 fL (ref 79–97)
Platelets: 231 10*3/uL (ref 150–450)
RBC: 4.98 x10E6/uL (ref 3.77–5.28)
RDW: 14.4 % (ref 11.7–15.4)
WBC: 4.2 10*3/uL (ref 3.4–10.8)

## 2024-02-28 LAB — PROLACTIN: Prolactin: 32.1 ng/mL (ref 4.8–33.4)

## 2024-02-29 ENCOUNTER — Other Ambulatory Visit: Admitting: Psychology

## 2024-03-01 LAB — THYROID PANEL WITH TSH
Free Thyroxine Index: 2 (ref 1.2–4.9)
T3 Uptake Ratio: 34 % (ref 24–39)
T4, Total: 5.9 ug/dL (ref 4.5–12.0)
TSH: 1.68 u[IU]/mL (ref 0.450–4.500)

## 2024-03-01 LAB — SPECIMEN STATUS REPORT

## 2024-03-07 ENCOUNTER — Other Ambulatory Visit: Admitting: Psychology

## 2024-03-14 ENCOUNTER — Other Ambulatory Visit: Admitting: Psychology
